# Patient Record
Sex: Female | Born: 1980 | Race: White | Hispanic: No | Marital: Single | Smoking: Current every day smoker
Health system: Southern US, Community
[De-identification: ages and names within clinical notes are randomized; demographics above are authoritative.]

## PROBLEM LIST (undated history)

## (undated) DIAGNOSIS — K219 Gastro-esophageal reflux disease without esophagitis: Secondary | ICD-10-CM

## (undated) DIAGNOSIS — I10 Essential (primary) hypertension: Secondary | ICD-10-CM

## (undated) DIAGNOSIS — N730 Acute parametritis and pelvic cellulitis: Secondary | ICD-10-CM

## (undated) HISTORY — DX: Acute parametritis and pelvic cellulitis: N73.0

## (undated) HISTORY — DX: Gastro-esophageal reflux disease without esophagitis: K21.9

## (undated) HISTORY — DX: Essential (primary) hypertension: I10

## (undated) HISTORY — PX: WISDOM TOOTH EXTRACTION: SHX21

---
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
## 1999-07-07 ENCOUNTER — Emergency Department (HOSPITAL_COMMUNITY): Admission: EM | Admit: 1999-07-07 | Discharge: 1999-07-07 | Payer: Self-pay | Admitting: Emergency Medicine

## 1999-10-12 ENCOUNTER — Emergency Department (HOSPITAL_COMMUNITY): Admission: EM | Admit: 1999-10-12 | Discharge: 1999-10-12 | Payer: Self-pay

## 1999-10-12 ENCOUNTER — Encounter: Payer: Self-pay | Admitting: Emergency Medicine

## 2000-12-03 ENCOUNTER — Other Ambulatory Visit: Admission: RE | Admit: 2000-12-03 | Discharge: 2000-12-03 | Payer: Self-pay | Admitting: Family Medicine

## 2001-03-31 DIAGNOSIS — N730 Acute parametritis and pelvic cellulitis: Secondary | ICD-10-CM

## 2001-03-31 HISTORY — DX: Acute parametritis and pelvic cellulitis: N73.0

## 2002-04-28 ENCOUNTER — Other Ambulatory Visit: Admission: RE | Admit: 2002-04-28 | Discharge: 2002-04-28 | Payer: Self-pay | Admitting: Family Medicine

## 2002-06-20 ENCOUNTER — Other Ambulatory Visit: Admission: RE | Admit: 2002-06-20 | Discharge: 2002-06-20 | Payer: Self-pay | Admitting: Obstetrics and Gynecology

## 2002-07-25 ENCOUNTER — Other Ambulatory Visit: Admission: RE | Admit: 2002-07-25 | Discharge: 2002-07-25 | Payer: Self-pay | Admitting: Obstetrics and Gynecology

## 2004-08-27 ENCOUNTER — Ambulatory Visit: Payer: Self-pay | Admitting: Family Medicine

## 2004-09-18 ENCOUNTER — Ambulatory Visit: Payer: Self-pay | Admitting: Family Medicine

## 2004-10-29 ENCOUNTER — Ambulatory Visit: Payer: Self-pay | Admitting: Family Medicine

## 2004-12-31 ENCOUNTER — Ambulatory Visit: Payer: Self-pay | Admitting: Family Medicine

## 2005-02-13 ENCOUNTER — Ambulatory Visit: Payer: Self-pay | Admitting: Family Medicine

## 2005-03-05 ENCOUNTER — Ambulatory Visit: Payer: Self-pay | Admitting: Family Medicine

## 2005-06-16 ENCOUNTER — Ambulatory Visit: Payer: Self-pay | Admitting: Family Medicine

## 2005-08-05 ENCOUNTER — Ambulatory Visit: Payer: Self-pay | Admitting: Family Medicine

## 2005-10-21 ENCOUNTER — Ambulatory Visit: Payer: Self-pay | Admitting: Family Medicine

## 2006-04-01 ENCOUNTER — Ambulatory Visit: Payer: Self-pay | Admitting: Family Medicine

## 2006-05-29 ENCOUNTER — Ambulatory Visit: Payer: Self-pay | Admitting: Family Medicine

## 2006-10-20 ENCOUNTER — Ambulatory Visit: Payer: Self-pay | Admitting: Family Medicine

## 2006-10-20 DIAGNOSIS — K219 Gastro-esophageal reflux disease without esophagitis: Secondary | ICD-10-CM

## 2006-10-20 DIAGNOSIS — I10 Essential (primary) hypertension: Secondary | ICD-10-CM

## 2006-10-22 ENCOUNTER — Telehealth: Payer: Self-pay | Admitting: Family Medicine

## 2007-01-11 ENCOUNTER — Ambulatory Visit: Payer: Self-pay | Admitting: Family Medicine

## 2007-01-11 DIAGNOSIS — N3000 Acute cystitis without hematuria: Secondary | ICD-10-CM | POA: Insufficient documentation

## 2007-01-11 LAB — CONVERTED CEMR LAB
Bilirubin Urine: NEGATIVE
Glucose, Urine, Semiquant: NEGATIVE
Ketones, urine, test strip: NEGATIVE
Nitrite: NEGATIVE
Protein, U semiquant: NEGATIVE
Specific Gravity, Urine: 1.01
Urobilinogen, UA: 0.2
WBC Urine, dipstick: NEGATIVE
pH: 6.5

## 2007-02-22 ENCOUNTER — Ambulatory Visit: Payer: Self-pay | Admitting: Family Medicine

## 2007-02-23 LAB — CONVERTED CEMR LAB
ALT: 16 units/L (ref 0–35)
AST: 16 units/L (ref 0–37)
Albumin: 4.3 g/dL (ref 3.5–5.2)
Alkaline Phosphatase: 54 units/L (ref 39–117)
BUN: 6 mg/dL (ref 6–23)
Basophils Absolute: 0 10*3/uL (ref 0.0–0.1)
Basophils Relative: 0.1 % (ref 0.0–1.0)
Bilirubin, Direct: 0.2 mg/dL (ref 0.0–0.3)
CO2: 29 meq/L (ref 19–32)
Calcium: 9.3 mg/dL (ref 8.4–10.5)
Chloride: 103 meq/L (ref 96–112)
Cholesterol: 137 mg/dL (ref 0–200)
Creatinine, Ser: 0.6 mg/dL (ref 0.4–1.2)
Eosinophils Absolute: 0.2 10*3/uL (ref 0.0–0.6)
Eosinophils Relative: 3.6 % (ref 0.0–5.0)
GFR calc Af Amer: 153 mL/min
GFR calc non Af Amer: 127 mL/min
Glucose, Bld: 77 mg/dL (ref 70–99)
HCT: 47.2 % — ABNORMAL HIGH (ref 36.0–46.0)
HDL: 33.6 mg/dL — ABNORMAL LOW (ref >39.0)
Hemoglobin: 16.5 g/dL — ABNORMAL HIGH (ref 12.0–15.0)
LDL Cholesterol: 88 mg/dL (ref 0–99)
Lymphocytes Relative: 27.5 % (ref 12.0–46.0)
MCHC: 35 g/dL (ref 30.0–36.0)
MCV: 99.4 fL (ref 78.0–100.0)
Monocytes Absolute: 0.5 10*3/uL (ref 0.2–0.7)
Monocytes Relative: 12.9 % — ABNORMAL HIGH (ref 3.0–11.0)
Neutro Abs: 2.3 10*3/uL (ref 1.4–7.7)
Neutrophils Relative %: 55.9 % (ref 43.0–77.0)
Platelets: 230 10*3/uL (ref 150–400)
Potassium: 3.7 meq/L (ref 3.5–5.1)
RBC: 4.75 M/uL (ref 3.87–5.11)
RDW: 11.6 % (ref 11.5–14.6)
Sodium: 138 meq/L (ref 135–145)
TSH: 1.9 microintl units/mL (ref 0.35–5.50)
Total Bilirubin: 1.2 mg/dL (ref 0.3–1.2)
Total CHOL/HDL Ratio: 4.1
Total Protein: 6.9 g/dL (ref 6.0–8.3)
Triglycerides: 75 mg/dL (ref 0–149)
VLDL: 15 mg/dL (ref 0–40)
WBC: 4.2 10*3/uL — ABNORMAL LOW (ref 4.5–10.5)

## 2007-10-20 ENCOUNTER — Telehealth: Payer: Self-pay | Admitting: Internal Medicine

## 2007-10-21 ENCOUNTER — Ambulatory Visit: Payer: Self-pay | Admitting: Internal Medicine

## 2007-10-21 LAB — CONVERTED CEMR LAB
Bilirubin Urine: NEGATIVE
Blood in Urine, dipstick: NEGATIVE
Glucose, Urine, Semiquant: NEGATIVE
Ketones, urine, test strip: NEGATIVE
Nitrite: NEGATIVE
Protein, U semiquant: NEGATIVE
Specific Gravity, Urine: 1.005
Urobilinogen, UA: 0.2
WBC Urine, dipstick: NEGATIVE
pH: 7

## 2007-10-27 ENCOUNTER — Telehealth: Payer: Self-pay | Admitting: Family Medicine

## 2007-11-03 ENCOUNTER — Telehealth: Payer: Self-pay | Admitting: Family Medicine

## 2007-12-10 ENCOUNTER — Telehealth: Payer: Self-pay | Admitting: Family Medicine

## 2008-03-07 ENCOUNTER — Ambulatory Visit: Payer: Self-pay | Admitting: Family Medicine

## 2008-03-07 DIAGNOSIS — F411 Generalized anxiety disorder: Secondary | ICD-10-CM | POA: Insufficient documentation

## 2008-03-07 DIAGNOSIS — J309 Allergic rhinitis, unspecified: Secondary | ICD-10-CM | POA: Insufficient documentation

## 2008-03-07 LAB — CONVERTED CEMR LAB
Cholesterol, target level: 200 mg/dL
HDL goal, serum: 40 mg/dL
LDL Goal: 130 mg/dL

## 2008-03-21 ENCOUNTER — Ambulatory Visit: Payer: Self-pay | Admitting: Family Medicine

## 2008-03-21 LAB — CONVERTED CEMR LAB
Bilirubin Urine: NEGATIVE
Blood in Urine, dipstick: NEGATIVE
Glucose, Urine, Semiquant: NEGATIVE
Ketones, urine, test strip: NEGATIVE
Nitrite: NEGATIVE
Protein, U semiquant: NEGATIVE
Specific Gravity, Urine: 1.015
Urobilinogen, UA: 0.2
WBC Urine, dipstick: NEGATIVE
pH: 7

## 2008-03-23 LAB — CONVERTED CEMR LAB
ALT: 18 units/L (ref 0–35)
AST: 22 units/L (ref 0–37)
Albumin: 4.1 g/dL (ref 3.5–5.2)
Alkaline Phosphatase: 63 units/L (ref 39–117)
BUN: 5 mg/dL — ABNORMAL LOW (ref 6–23)
Basophils Absolute: 0 10*3/uL (ref 0.0–0.1)
Basophils Relative: 0.5 % (ref 0.0–3.0)
Bilirubin, Direct: 0.1 mg/dL (ref 0.0–0.3)
CO2: 28 meq/L (ref 19–32)
Calcium: 9.2 mg/dL (ref 8.4–10.5)
Chloride: 107 meq/L (ref 96–112)
Cholesterol: 158 mg/dL (ref 0–200)
Creatinine, Ser: 0.6 mg/dL (ref 0.4–1.2)
Eosinophils Absolute: 0.2 10*3/uL (ref 0.0–0.7)
Eosinophils Relative: 3.7 % (ref 0.0–5.0)
GFR calc Af Amer: 152 mL/min
GFR calc non Af Amer: 126 mL/min
Glucose, Bld: 95 mg/dL (ref 70–99)
HCT: 44.8 % (ref 36.0–46.0)
HDL: 47.6 mg/dL (ref >39.0)
Hemoglobin: 16 g/dL — ABNORMAL HIGH (ref 12.0–15.0)
LDL Cholesterol: 98 mg/dL (ref 0–99)
Lymphocytes Relative: 31.6 % (ref 12.0–46.0)
MCHC: 35.7 g/dL (ref 30.0–36.0)
MCV: 100.8 fL — ABNORMAL HIGH (ref 78.0–100.0)
Monocytes Absolute: 0.5 10*3/uL (ref 0.1–1.0)
Monocytes Relative: 9.8 % (ref 3.0–12.0)
Neutro Abs: 2.5 10*3/uL (ref 1.4–7.7)
Neutrophils Relative %: 54.4 % (ref 43.0–77.0)
Platelets: 235 10*3/uL (ref 150–400)
Potassium: 3.2 meq/L — ABNORMAL LOW (ref 3.5–5.1)
RBC: 4.45 M/uL (ref 3.87–5.11)
RDW: 11.6 % (ref 11.5–14.6)
Sodium: 142 meq/L (ref 135–145)
TSH: 2.65 microintl units/mL (ref 0.35–5.50)
Total Bilirubin: 1 mg/dL (ref 0.3–1.2)
Total CHOL/HDL Ratio: 3.3
Total Protein: 6.9 g/dL (ref 6.0–8.3)
Triglycerides: 64 mg/dL (ref 0–149)
VLDL: 13 mg/dL (ref 0–40)
WBC: 4.7 10*3/uL (ref 4.5–10.5)

## 2008-03-28 ENCOUNTER — Ambulatory Visit: Payer: Self-pay | Admitting: Family Medicine

## 2008-04-04 ENCOUNTER — Ambulatory Visit: Payer: Self-pay | Admitting: Gastroenterology

## 2008-04-14 ENCOUNTER — Telehealth: Payer: Self-pay | Admitting: Family Medicine

## 2008-04-24 ENCOUNTER — Ambulatory Visit: Payer: Self-pay | Admitting: Gastroenterology

## 2008-05-01 ENCOUNTER — Encounter: Payer: Self-pay | Admitting: Family Medicine

## 2008-05-03 ENCOUNTER — Encounter: Payer: Self-pay | Admitting: Gastroenterology

## 2008-05-08 ENCOUNTER — Telehealth (INDEPENDENT_AMBULATORY_CARE_PROVIDER_SITE_OTHER): Payer: Self-pay | Admitting: *Deleted

## 2008-05-08 ENCOUNTER — Ambulatory Visit: Payer: Self-pay | Admitting: Family Medicine

## 2008-05-08 DIAGNOSIS — J039 Acute tonsillitis, unspecified: Secondary | ICD-10-CM

## 2008-05-10 ENCOUNTER — Telehealth: Payer: Self-pay | Admitting: Family Medicine

## 2008-06-02 ENCOUNTER — Ambulatory Visit: Payer: Self-pay | Admitting: Family Medicine

## 2008-06-02 ENCOUNTER — Telehealth (INDEPENDENT_AMBULATORY_CARE_PROVIDER_SITE_OTHER): Payer: Self-pay | Admitting: *Deleted

## 2008-06-02 LAB — CONVERTED CEMR LAB
Bilirubin Urine: NEGATIVE
Blood in Urine, dipstick: NEGATIVE
Glucose, Urine, Semiquant: NEGATIVE
Ketones, urine, test strip: NEGATIVE
Nitrite: NEGATIVE
Protein, U semiquant: NEGATIVE
Specific Gravity, Urine: 1.01
Urobilinogen, UA: 0.2
WBC Urine, dipstick: NEGATIVE
pH: 7

## 2008-06-23 ENCOUNTER — Encounter: Payer: Self-pay | Admitting: Family Medicine

## 2009-01-12 ENCOUNTER — Ambulatory Visit: Payer: Self-pay | Admitting: Family Medicine

## 2009-01-12 LAB — CONVERTED CEMR LAB
Beta hcg, urine, semiquantitative: NEGATIVE
Bilirubin Urine: NEGATIVE
Ketones, urine, test strip: NEGATIVE
Nitrite: NEGATIVE
Specific Gravity, Urine: 1.01

## 2009-05-08 ENCOUNTER — Ambulatory Visit: Payer: Self-pay | Admitting: Family Medicine

## 2009-05-09 ENCOUNTER — Ambulatory Visit: Payer: Self-pay | Admitting: Family Medicine

## 2009-05-30 ENCOUNTER — Ambulatory Visit: Payer: Self-pay | Admitting: Family Medicine

## 2009-05-30 DIAGNOSIS — J019 Acute sinusitis, unspecified: Secondary | ICD-10-CM

## 2009-06-11 ENCOUNTER — Ambulatory Visit: Payer: Self-pay | Admitting: Family Medicine

## 2009-06-11 LAB — CONVERTED CEMR LAB
Bilirubin Urine: NEGATIVE
Glucose, Urine, Semiquant: NEGATIVE
Urobilinogen, UA: 0.2

## 2009-06-12 LAB — CONVERTED CEMR LAB
ALT: 15 units/L (ref 0–35)
AST: 17 units/L (ref 0–37)
BUN: 5 mg/dL — ABNORMAL LOW (ref 6–23)
Basophils Relative: 0 % (ref 0.0–3.0)
Chloride: 110 meq/L (ref 96–112)
Cholesterol: 160 mg/dL (ref 0–200)
Eosinophils Relative: 3.8 % (ref 0.0–5.0)
GFR calc non Af Amer: 123.82 mL/min (ref >60)
HCT: 43.4 % (ref 36.0–46.0)
Hemoglobin: 14.8 g/dL (ref 12.0–15.0)
LDL Cholesterol: 96 mg/dL (ref 0–99)
Lymphs Abs: 2.1 10*3/uL (ref 0.7–4.0)
MCV: 101.5 fL — ABNORMAL HIGH (ref 78.0–100.0)
Monocytes Absolute: 0.5 10*3/uL (ref 0.1–1.0)
Monocytes Relative: 10 % (ref 3.0–12.0)
Neutro Abs: 2.3 10*3/uL (ref 1.4–7.7)
Potassium: 3.6 meq/L (ref 3.5–5.1)
RBC: 4.28 M/uL (ref 3.87–5.11)
Sodium: 144 meq/L (ref 135–145)
TSH: 3.09 microintl units/mL (ref 0.35–5.50)
Total Bilirubin: 1 mg/dL (ref 0.3–1.2)
Total Protein: 7.1 g/dL (ref 6.0–8.3)
VLDL: 13.4 mg/dL (ref 0.0–40.0)
WBC: 5.1 10*3/uL (ref 4.5–10.5)

## 2009-07-25 ENCOUNTER — Ambulatory Visit: Payer: Self-pay | Admitting: Family Medicine

## 2010-04-20 ENCOUNTER — Encounter: Payer: Self-pay | Admitting: Family Medicine

## 2010-04-30 NOTE — Assessment & Plan Note (Signed)
Summary: fu on neck pain OVER 1 WK/NJR   Vital Signs:  Patient profile:   30 year old female Weight:      149 pounds Temp:     97.7 degrees F oral BP sitting:   128 / 92  Vitals Entered By: Lynann Beaver CMA (May 08, 2009 4:06 PM) CC: neck pain Is Patient Diabetic? No   History of Present Illness: For the past week has had stiffness and pain in the neck. No trauma. No radiation down the arms. Heat and Motrin helps.  Allergies (verified): No Known Drug Allergies  Past History:  Past Medical History: Reviewed history from 03/28/2008 and no changes required. Hypertension GERD PID 2003  sees [place] for GYN exams  Past Surgical History: Reviewed history from 02/22/2007 and no changes required. wisdom teeth  Review of Systems  The patient denies anorexia, fever, weight loss, weight gain, vision loss, decreased hearing, hoarseness, chest pain, syncope, dyspnea on exertion, peripheral edema, prolonged cough, headaches, hemoptysis, abdominal pain, melena, hematochezia, severe indigestion/heartburn, hematuria, incontinence, genital sores, muscle weakness, suspicious skin lesions, transient blindness, difficulty walking, depression, unusual weight change, abnormal bleeding, enlarged lymph nodes, angioedema, breast masses, and testicular masses.    Physical Exam  General:  Well-developed,well-nourished,in no acute distress; alert,appropriate and cooperative throughout examination Neck:  tender  with lots of spasm and limited ROM    Impression & Recommendations:  Problem # 1:  NECK PAIN (ICD-723.1) Assessment Unchanged  Her updated medication list for this problem includes:    Flexeril 10 Mg Tabs (Cyclobenzaprine hcl) .Marland Kitchen... Three times a day as needed spasm    Diclofenac Sodium 50 Mg Tbec (Diclofenac sodium) .Marland Kitchen... Three times a day as needed pain  Orders: T-Cervical Spine Comp w/Flex & Ext (04540JW)  Complete Medication List: 1)  Mirena 20 Mcg/24hr Iud  (Levonorgestrel) .... Since 4-08 2)  Flexeril 10 Mg Tabs (Cyclobenzaprine hcl) .... Three times a day as needed spasm 3)  Diclofenac Sodium 50 Mg Tbec (Diclofenac sodium) .... Three times a day as needed pain  Patient Instructions: 1)  We will get Xrays of the neck.  Prescriptions: DICLOFENAC SODIUM 50 MG TBEC (DICLOFENAC SODIUM) three times a day as needed pain  #60 x 5   Entered and Authorized by:   Nelwyn Salisbury MD   Signed by:   Nelwyn Salisbury MD on 05/08/2009   Method used:   Electronically to        [facility] [address] (retail)       [address]       [place], [place]  [address]       Ph: [phone] or [phone]       Fax: [phone]   RxID:   [id number] FLEXERIL 10 MG TABS (CYCLOBENZAPRINE HCL) three times a day as needed spasm  #60 x 5   Entered and Authorized by:   Nelwyn Salisbury MD   Signed by:   Nelwyn Salisbury MD on 05/08/2009   Method used:   Electronically to        [facility] [address] (retail)       [address]       [place], [place]  [address]       Ph: [phone] or [phone]       Fax: [phone]   RxID:   [id number]

## 2010-04-30 NOTE — Assessment & Plan Note (Signed)
Summary: cpx/no pap/njr---PT [facility] // RS   Vital Signs:  Patient profile:   30 year old female Weight:      149 pounds BMI:     26.49 BP sitting:   130 / 100  (left arm) Cuff size:   regular  Vitals Entered By: Raechel Ache, RN (July 25, 2009 1:45 PM) CC: CPX,  labs done. Sees gyn.   History of Present Illness: 30 yr old female for a cpx. She feels fine, tries to eat right, and walks/runs 6 miles three days a week. her BP remains borderline high, however. She had her yearly GYN exam yesterday, and her BP there was 140/90. She continues to smoke.   Preventive Screening-Counseling & Management  Alcohol-Tobacco     Smoking Status: current     Smoking Cessation Counseling: YES     Packs/Day: 1.0  Allergies (verified): No Known Drug Allergies  Past History:  Past Medical History: Reviewed history from 03/28/2008 and no changes required. Hypertension GERD PID 2003  sees [place] for GYN exams  Past Surgical History: Reviewed history from 02/22/2007 and no changes required. wisdom teeth  Family History: Reviewed history from 04/04/2008 and no changes required. Family History of Ovarian Cancer:GM Family History of Diabetes: Father Family History of Kidney Disease:Father  Social History: Reviewed history from 10/21/2007 and no changes required. Single Current Smoker Alcohol use-yes works Designer, jewell[employer] Packs/Day:  1.0  Review of Systems  The patient denies anorexia, fever, weight loss, weight gain, vision loss, decreased hearing, hoarseness, chest pain, syncope, dyspnea on exertion, peripheral edema, prolonged cough, headaches, hemoptysis, abdominal pain, melena, hematochezia, severe indigestion/heartburn, hematuria, incontinence, genital sores, muscle weakness, suspicious skin lesions, transient blindness, difficulty walking, depression, unusual weight change, abnormal bleeding, enlarged lymph nodes, angioedema, breast masses, and testicular masses.     Physical Exam  General:  Well-developed,well-nourished,in no acute distress; alert,appropriate and cooperative throughout examination Head:  Normocephalic and atraumatic without obvious abnormalities. No apparent alopecia or balding. Eyes:  No corneal or conjunctival inflammation noted. EOMI. Perrla. Funduscopic exam benign, without hemorrhages, exudates or papilledema. Vision grossly normal. Ears:  External ear exam shows no significant lesions or deformities.  Otoscopic examination reveals clear canals, tympanic membranes are intact bilaterally without bulging, retraction, inflammation or discharge. Hearing is grossly normal bilaterally. Nose:  External nasal examination shows no deformity or inflammation. Nasal mucosa are pink and moist without lesions or exudates. Mouth:  Oral mucosa and oropharynx without lesions or exudates.  Teeth in good repair. Neck:  No deformities, masses, or tenderness noted. Chest Wall:  No deformities, masses, or tenderness noted. Lungs:  Normal respiratory effort, chest expands symmetrically. Lungs are clear to auscultation, no crackles or wheezes. Heart:  Normal rate and regular rhythm. S1 and S2 normal without gallop, murmur, click, rub or other extra sounds. Abdomen:  Bowel sounds positive,abdomen soft and non-tender without masses, organomegaly or hernias noted. Msk:  No deformity or scoliosis noted of thoracic or lumbar spine.   Pulses:  R and L carotid,radial,femoral,dorsalis pedis and posterior tibial pulses are full and equal bilaterally Extremities:  No clubbing, cyanosis, edema, or deformity noted with normal full range of motion of all joints.   Neurologic:  No cranial nerve deficits noted. Station and gait are normal. Plantar reflexes are down-going bilaterally. DTRs are symmetrical throughout. Sensory, motor and coordinative functions appear intact. Skin:  Intact without suspicious lesions or rashes Cervical Nodes:  No lymphadenopathy noted Axillary  Nodes:  No palpable lymphadenopathy Inguinal Nodes:  No significant  adenopathy Psych:  Cognition and judgment appear intact. Alert and cooperative with normal attention span and concentration. No apparent delusions, illusions, hallucinations   Impression & Recommendations:  Problem # 1:  WELL ADULT EXAM (ICD-V70.0)  Complete Medication List: 1)  Mirena 20 Mcg/24hr Iud (Levonorgestrel) .... Since 4-08 2)  Chantix Starting Month Pak 0.5 Mg X 11 & 1 Mg X 42 Tabs (Varenicline tartrate) .... As directed 3)  Chantix Continuing Month Pak 1 Mg Tabs (Varenicline tartrate) .... As directed  Patient Instructions: 1)  Tobacco is very bad for your health and your loved ones ! You should stop smoking !  2)  It is important that you exercise reguarly at least 20 minutes 5 times a week. If you develop chest pain, have severe difficulty breathing, or feel very tired, stop exercising immediately and seek medical attention.  3)  You need to lose weight. Consider a lower calorie diet and regular exercise.  4)  Please schedule a follow-up appointment in 3 months .  Prescriptions: CHANTIX CONTINUING MONTH PAK 1 MG TABS (VARENICLINE TARTRATE) as directed  #1 x 1   Entered and Authorized by:   Nelwyn Salisbury MD   Signed by:   Nelwyn Salisbury MD on 07/25/2009   Method used:   Electronically to        [facility] [address] (retail)       [address]       [place], [place]  [address]       Ph: [phone] or [phone]       Fax: [phone]   RxID:   [id number] CHANTIX STARTING MONTH PAK 0.5 MG X 11 & 1 MG X 42 TABS (VARENICLINE TARTRATE) as directed  #1 x 0   Entered and Authorized by:   Nelwyn Salisbury MD   Signed by:   Nelwyn Salisbury MD on 07/25/2009   Method used:   Electronically to        [facility] [address] (retail)       [address]       [place], [place]  [address]       Ph: [phone] or [phone]        Fax: [phone]   RxID:   [id number]

## 2010-04-30 NOTE — Assessment & Plan Note (Signed)
Summary: ST, CONGESTION // RS   Vital Signs:  Patient profile:   30 year old female Weight:      150 pounds Temp:     96.8 degrees F oral Pulse rate:   93 / minute BP sitting:   134 / 102  (left arm) Cuff size:   large  Vitals Entered By: Alfred Levins, CMA (May 30, 2009 3:17 PM) CC: sinus pressure, st, low grade fever x4 days   History of Present Illness: Here for one week of sinus pressure, HA, PND, ST, fever to 99 degrees, and a dry cough. No NVD.   Current Medications (verified): 1)  Mirena 20 Mcg/24hr  Iud (Levonorgestrel) .... Since 4-08  Allergies (verified): No Known Drug Allergies  Past History:  Past Medical History: Reviewed history from 03/28/2008 and no changes required. Hypertension GERD PID 2003  sees [place] for GYN exams  Review of Systems  The patient denies anorexia, weight loss, weight gain, vision loss, decreased hearing, hoarseness, chest pain, syncope, dyspnea on exertion, peripheral edema, hemoptysis, abdominal pain, melena, hematochezia, severe indigestion/heartburn, hematuria, incontinence, genital sores, muscle weakness, suspicious skin lesions, transient blindness, difficulty walking, depression, unusual weight change, abnormal bleeding, enlarged lymph nodes, angioedema, breast masses, and testicular masses.    Physical Exam  General:  Well-developed,well-nourished,in no acute distress; alert,appropriate and cooperative throughout examination Head:  Normocephalic and atraumatic without obvious abnormalities. No apparent alopecia or balding. Eyes:  No corneal or conjunctival inflammation noted. EOMI. Perrla. Funduscopic exam benign, without hemorrhages, exudates or papilledema. Vision grossly normal. Ears:  External ear exam shows no significant lesions or deformities.  Otoscopic examination reveals clear canals, tympanic membranes are intact bilaterally without bulging, retraction, inflammation or discharge. Hearing is grossly normal  bilaterally. Nose:  External nasal examination shows no deformity or inflammation. Nasal mucosa are pink and moist without lesions or exudates. Mouth:  Oral mucosa and oropharynx without lesions or exudates.  Teeth in good repair. Neck:  No deformities, masses, or tenderness noted. Lungs:  Normal respiratory effort, chest expands symmetrically. Lungs are clear to auscultation, no crackles or wheezes.   Impression & Recommendations:  Problem # 1:  ACUTE SINUSITIS, UNSPECIFIED (ICD-461.9)  Her updated medication list for this problem includes:    Zithromax Z-pak 250 Mg Tabs (Azithromycin) .Marland Kitchen... As directed  Complete Medication List: 1)  Mirena 20 Mcg/24hr Iud (Levonorgestrel) .... Since 4-08 2)  Zithromax Z-pak 250 Mg Tabs (Azithromycin) .... As directed  Patient Instructions: 1)  Please schedule a follow-up appointment as needed .  Prescriptions: ZITHROMAX Z-PAK 250 MG TABS (AZITHROMYCIN) as directed  #1 x 0   Entered and Authorized by:   Nelwyn Salisbury MD   Signed by:   Nelwyn Salisbury MD on 05/30/2009   Method used:   Electronically to        [facility] [address] [phone]* (retail)       [address]       [place], [place]  [address]       Ph: [phone] or [phone]       Fax: [phone]   RxID:   [id number]

## 2010-08-16 NOTE — Assessment & Plan Note (Signed)
[facility]                                 ON-CALL NOTE   RAEANA, BLINN                   MRN:          [id number]  DATE:04/11/2006                            DOB:          [birth year]    PRIMARY CARE PHYSICIAN:  Dr. Clent Ridges.   Ms. Freelove calls in today stating that she has thrush. She wanted a  medication to be called in.   PLAN:  The patient was offered to come to the Saturday clinic to be  assessed.     Leanne Chang, M.D.  Electronically Signed    LA/MedQ  DD: 04/11/2006  DT: 04/12/2006  Job #: [id number]

## 2011-01-09 ENCOUNTER — Encounter: Payer: Self-pay | Admitting: Family Medicine

## 2011-01-10 ENCOUNTER — Encounter: Payer: Self-pay | Admitting: Family Medicine

## 2011-01-10 ENCOUNTER — Ambulatory Visit (INDEPENDENT_AMBULATORY_CARE_PROVIDER_SITE_OTHER): Payer: Managed Care, Other (non HMO) | Admitting: Family Medicine

## 2011-01-10 VITALS — BP 120/80 | HR 95 | Temp 98.7°F

## 2011-01-10 DIAGNOSIS — L259 Unspecified contact dermatitis, unspecified cause: Secondary | ICD-10-CM

## 2011-01-10 DIAGNOSIS — L309 Dermatitis, unspecified: Secondary | ICD-10-CM

## 2011-01-10 MED ORDER — HALOBETASOL PROPIONATE 0.05 % EX CREA: TOPICAL_CREAM | Freq: Two times a day (BID) | CUTANEOUS | Status: AC

## 2011-01-10 NOTE — Progress Notes (Signed)
  Subjective:    Patient ID: Becky Estrada, female    DOB: 30-Sep-1980, 30 y.o.   MRN: [id number]  HPI Here for an itchy rash on both lower legs for the past 2 months.Using OTC moisturizers.    Review of Systems  Constitutional: Negative.   Skin: Positive for rash.       Objective:   Physical Exam  Constitutional: She appears well-developed and well-nourished.  Skin:       Patches of red, macular, scaly skin on the lower legs           Assessment & Plan:  Recheck prn

## 2011-01-27 ENCOUNTER — Other Ambulatory Visit (INDEPENDENT_AMBULATORY_CARE_PROVIDER_SITE_OTHER): Payer: Managed Care, Other (non HMO)

## 2011-01-27 DIAGNOSIS — Z0000 Encounter for general adult medical examination without abnormal findings: Secondary | ICD-10-CM

## 2011-01-27 LAB — BASIC METABOLIC PANEL
CO2: 26 mEq/L (ref 19–32)
Calcium: 8.7 mg/dL (ref 8.4–10.5)
Creatinine, Ser: 0.7 mg/dL (ref 0.4–1.2)
GFR: 106.06 mL/min (ref >60.00)
Sodium: 136 mEq/L (ref 135–145)

## 2011-01-27 LAB — CBC WITH DIFFERENTIAL/PLATELET
Basophils Absolute: 0 10*3/uL (ref 0.0–0.1)
Basophils Relative: 0.2 % (ref 0.0–3.0)
Eosinophils Absolute: 0.2 10*3/uL (ref 0.0–0.7)
Hemoglobin: 15.5 g/dL — ABNORMAL HIGH (ref 12.0–15.0)
Lymphocytes Relative: 36.5 % (ref 12.0–46.0)
MCHC: 34.9 g/dL (ref 30.0–36.0)
Monocytes Relative: 10.2 % (ref 3.0–12.0)
Neutrophils Relative %: 48.8 % (ref 43.0–77.0)
RBC: 4.25 Mil/uL (ref 3.87–5.11)
RDW: 12.7 % (ref 11.5–14.6)

## 2011-01-27 LAB — HEPATIC FUNCTION PANEL
AST: 25 U/L (ref 0–37)
Alkaline Phosphatase: 70 U/L (ref 39–117)
Bilirubin, Direct: 0.1 mg/dL (ref 0.0–0.3)

## 2011-01-27 LAB — TSH: TSH: 2.91 u[IU]/mL (ref 0.35–5.50)

## 2011-01-27 LAB — LIPID PANEL
HDL: 44.6 mg/dL (ref >39.00)
Total CHOL/HDL Ratio: 3
VLDL: 24.8 mg/dL (ref 0.0–40.0)

## 2011-01-27 LAB — POCT URINALYSIS DIPSTICK
Bilirubin, UA: NEGATIVE
Ketones, UA: NEGATIVE
Leukocytes, UA: NEGATIVE
pH, UA: 7

## 2011-01-30 ENCOUNTER — Telehealth: Payer: Self-pay | Admitting: Family Medicine

## 2011-01-30 NOTE — Telephone Encounter (Signed)
Message copied by Baldemar Friday on Thu Jan 30, 2011  1:49 PM ------      Message from: Gershon Crane A      Created: Tue Jan 28, 2011  8:47 AM       normal

## 2011-01-30 NOTE — Telephone Encounter (Signed)
Left voice message with normal results. 

## 2011-02-03 ENCOUNTER — Telehealth: Payer: Self-pay | Admitting: Family Medicine

## 2011-02-03 ENCOUNTER — Encounter: Payer: Self-pay | Admitting: Family Medicine

## 2011-02-03 NOTE — Telephone Encounter (Signed)
Pt had to cancel cpx due to mtg in [place] today. Pt need cpx before 02-13-2011. Can I create 30 min slot.

## 2011-02-04 NOTE — Telephone Encounter (Signed)
lmom for pt to cb

## 2011-02-04 NOTE — Telephone Encounter (Signed)
Please do so

## 2011-02-07 NOTE — Telephone Encounter (Signed)
lmom for pt to cb

## 2011-02-10 NOTE — Telephone Encounter (Signed)
Pt returned call re: sch work in cpx. Pt said that it has to be before 02/12/11 not 02/13/11. Pt has been sch for cpx on 02/11/11 at 3:45pm as noted.

## 2011-02-11 ENCOUNTER — Ambulatory Visit (INDEPENDENT_AMBULATORY_CARE_PROVIDER_SITE_OTHER): Payer: Managed Care, Other (non HMO) | Admitting: Family Medicine

## 2011-02-11 ENCOUNTER — Encounter: Payer: Self-pay | Admitting: Family Medicine

## 2011-02-11 VITALS — BP 140/90 | HR 99 | Temp 98.3°F | Ht 63.0 in | Wt 167.0 lb

## 2011-02-11 DIAGNOSIS — Z0000 Encounter for general adult medical examination without abnormal findings: Secondary | ICD-10-CM

## 2011-02-11 NOTE — Progress Notes (Signed)
  Subjective:    Patient ID: Becky Estrada, female    DOB: 03/17/1981, 30 y.o.   MRN: [id number]  HPI 30 yr old female for a cpx. She feels well except she has noticed some spider veins in her legs over the past year, and her legs get sensitive or painful at times. They do not swell very much. She is on her feet all day on her job.    Review of Systems  Constitutional: Negative.   HENT: Negative.   Eyes: Negative.   Respiratory: Negative.   Cardiovascular: Negative.   Gastrointestinal: Negative.   Genitourinary: Negative for dysuria, urgency, frequency, hematuria, flank pain, decreased urine volume, enuresis, difficulty urinating, pelvic pain and dyspareunia.  Musculoskeletal: Negative.   Skin: Negative.   Neurological: Negative.   Hematological: Negative.   Psychiatric/Behavioral: Negative.        Objective:   Physical Exam  Constitutional: She is oriented to person, place, and time. She appears well-developed and well-nourished. No distress.  HENT:  Head: Normocephalic and atraumatic.  Right Ear: External ear normal.  Left Ear: External ear normal.  Nose: Nose normal.  Mouth/Throat: Oropharynx is clear and moist. No oropharyngeal exudate.  Eyes: Conjunctivae and EOM are normal. Pupils are equal, round, and reactive to light. No scleral icterus.  Neck: Normal range of motion. Neck supple. No JVD present. No thyromegaly present.  Cardiovascular: Normal rate, regular rhythm, normal heart sounds and intact distal pulses.  Exam reveals no gallop and no friction rub.   No murmur heard. Pulmonary/Chest: Effort normal and breath sounds normal. No respiratory distress. She has no wheezes. She has no rales. She exhibits no tenderness.  Abdominal: Soft. Bowel sounds are normal. She exhibits no distension and no mass. There is no tenderness. There is no rebound and no guarding.  Musculoskeletal: Normal range of motion. She exhibits no edema and no tenderness.       She has numerous  superficial varicosities in the legs from the knees down   Lymphadenopathy:    She has no cervical adenopathy.  Neurological: She is alert and oriented to person, place, and time. She has normal reflexes. No cranial nerve deficit. She exhibits normal muscle tone. Coordination normal.  Skin: Skin is warm and dry. No rash noted. No erythema.  Psychiatric: She has a normal mood and affect. Her behavior is normal. Judgment and thought content normal.          Assessment & Plan:  She has some varicose veins, and her weight has a lot to do with this. She needs to exercise and lose weight. She can try wearing support stockings when on her feet.

## 2011-02-14 ENCOUNTER — Encounter: Payer: Self-pay | Admitting: Internal Medicine

## 2011-02-14 ENCOUNTER — Ambulatory Visit (INDEPENDENT_AMBULATORY_CARE_PROVIDER_SITE_OTHER): Payer: Managed Care, Other (non HMO) | Admitting: Internal Medicine

## 2011-02-14 VITALS — BP 120/80 | HR 123 | Temp 98.5°F | Wt 168.0 lb

## 2011-02-14 DIAGNOSIS — F17200 Nicotine dependence, unspecified, uncomplicated: Secondary | ICD-10-CM

## 2011-02-14 DIAGNOSIS — R6889 Other general symptoms and signs: Secondary | ICD-10-CM

## 2011-02-14 NOTE — Progress Notes (Signed)
  Subjective:    Patient ID: Becky Estrada, female    DOB: 10/28/1980, 30 y.o.   MRN: [id number]  HPI Patient comes in today for SDA  For acute problem evaluation. Less tthan or about 24 hours if body aches malaise upeer resp congestion some HA Some cough  NO cp sob fever but some chills . Slept in didn't get flu shot for psychological reasons yet.  Tobacco daily  but stopped for 2 days   Review of Systems No vision hearting change nvd rash noted  No uti sx  No NVD  Past history family history social history reviewed in the electronic medical record.     Objective:   Physical Exam  WDWN in nad looks tired  Non toxic  Prefers to lay down. HEENT: Normocephalic ;atraumatic , Eyes;  PERRL, EOMs  Full, lids and conjunctiva clear,,Ears: no deformities, canals nl, TM landmarks normal, Nose: no deformity or discharge  But congested  Mouth : OP clear without lesion or edema . Tonsil 1 + no exudate Neck: Supple without adenopathy or masses or bruits Chest:  Clear to A&P without wheezes rales or rhonchi CV:  S1-S2 no gallops or murmurs peripheral perfusion is normal Abdomen:  Sof,t normal bowel sounds without hepatosplenomegaly, no guarding rebound or masses no CVA tenderness No clubbing cyanosis or edema Skin: normal capillary refill ,turgor , color: No acute rashes ,petechiae or bruising      Assessment & Plan:  Acute illness  Seems flu like  resp . Non focal otherwise  No evidence of pneumonia  Or complications  Tobacco  Counseled. To stop    Expectant management. And rest  Call or seek care  if  persistent or progressive or alarm symptoms as discussed.

## 2011-02-14 NOTE — Patient Instructions (Signed)
This is a flu like illness . Rest fluids tylenol or advil for body aches  Feverish can last 3 days call if longer  Or if shortness of breath or severe pain Cough could last for a  Week or 2 . Call if need note for work.

## 2011-02-17 ENCOUNTER — Telehealth: Payer: Self-pay | Admitting: Family Medicine

## 2011-02-17 NOTE — Telephone Encounter (Signed)
Pt stated she will go to [facility]

## 2011-02-17 NOTE — Telephone Encounter (Signed)
I can see her tomorrow.

## 2011-02-17 NOTE — Telephone Encounter (Signed)
Pt saw dr Fabian Sharp on 02-14-2011 for flu like symptoms. Pt has fever and sore throat now requesting to see dr fry today.

## 2011-02-27 ENCOUNTER — Telehealth: Payer: Self-pay | Admitting: Family Medicine

## 2011-02-27 NOTE — Telephone Encounter (Signed)
Pt has irritation in both eyes woke up this morning and right eye was crusted shut and eye had been "goopy" through out the day. Pt requesting for something to be called in. Please contact

## 2011-02-28 MED ORDER — NEOMYCIN-POLYMYXIN-HC 3.5-10000-1 OP SUSP: 1.0000 [drp] | Freq: Four times a day (QID) | OPHTHALMIC | Status: AC

## 2011-02-28 NOTE — Telephone Encounter (Signed)
rx sent to pharmacy.  Pt aware.  

## 2011-02-28 NOTE — Telephone Encounter (Signed)
Pt is aware waiting on MD to reply.

## 2011-02-28 NOTE — Telephone Encounter (Signed)
Call in Cortisporin eye drops to use qid prn, 10 ml with no rf

## 2011-10-17 ENCOUNTER — Encounter: Payer: Self-pay | Admitting: Family Medicine

## 2011-10-17 ENCOUNTER — Ambulatory Visit (INDEPENDENT_AMBULATORY_CARE_PROVIDER_SITE_OTHER): Payer: Managed Care, Other (non HMO) | Admitting: Family Medicine

## 2011-10-17 VITALS — BP 124/86 | HR 84 | Temp 98.5°F | Wt 171.0 lb

## 2011-10-17 DIAGNOSIS — J039 Acute tonsillitis, unspecified: Secondary | ICD-10-CM

## 2011-10-17 DIAGNOSIS — L309 Dermatitis, unspecified: Secondary | ICD-10-CM

## 2011-10-17 DIAGNOSIS — L259 Unspecified contact dermatitis, unspecified cause: Secondary | ICD-10-CM

## 2011-10-17 MED ORDER — CEPHALEXIN 500 MG PO CAPS: 500.0000 mg | ORAL_CAPSULE | Freq: Three times a day (TID) | ORAL | Status: AC

## 2011-10-17 NOTE — Progress Notes (Signed)
  Subjective:    Patient ID: Becky Estrada, female    DOB: [birth year], 31 y.o.   MRN: [id number]  HPI Here for 2 reasons. First she has had 2 days of ST and swollen tonsils. No fever. Second her eczema is out of control despite treatment with Halobetasol cream.    Review of Systems  Constitutional: Negative.   HENT: Negative for congestion, postnasal drip and sinus pressure.   Eyes: Negative.   Respiratory: Negative.        Objective:   Physical Exam  Constitutional: She appears well-developed and well-nourished.  HENT:  Right Ear: External ear normal.  Left Ear: External ear normal.  Nose: Nose normal.  Mouth/Throat: No oropharyngeal exudate.       Tonsils are pink and swollen  Eyes: Conjunctivae are normal.  Neck: No thyromegaly present.  Pulmonary/Chest: Effort normal and breath sounds normal.  Lymphadenopathy:    She has no cervical adenopathy.  Skin:       Plaques of scaly skin over the legs           Assessment & Plan:  Treat the throat with Keflex. Refer to Dermatology.

## 2011-11-04 ENCOUNTER — Encounter: Payer: Self-pay | Admitting: Speech Pathology

## 2011-11-18 ENCOUNTER — Telehealth: Payer: Self-pay | Admitting: Family Medicine

## 2011-11-18 DIAGNOSIS — R221 Localized swelling, mass and lump, neck: Secondary | ICD-10-CM

## 2011-11-18 NOTE — Telephone Encounter (Signed)
Pt called and is req to get a referral to ENT re: the feeling of lump in pts throat, as previously discussed with Dr Clent Ridges. Pt said that symptoms are the same. Not going away. Pls leave detailed info on vm if pt not avail.

## 2011-11-19 NOTE — Telephone Encounter (Signed)
Referral was done  

## 2011-11-19 NOTE — Telephone Encounter (Signed)
I spoke with pt  

## 2012-10-05 ENCOUNTER — Telehealth: Payer: Self-pay | Admitting: Family Medicine

## 2012-10-05 ENCOUNTER — Encounter: Payer: Self-pay | Admitting: Family Medicine

## 2012-10-05 ENCOUNTER — Ambulatory Visit (INDEPENDENT_AMBULATORY_CARE_PROVIDER_SITE_OTHER): Payer: Managed Care, Other (non HMO) | Admitting: Family Medicine

## 2012-10-05 VITALS — BP 170/110 | HR 88 | Temp 97.9°F | Wt 172.0 lb

## 2012-10-05 DIAGNOSIS — I10 Essential (primary) hypertension: Secondary | ICD-10-CM

## 2012-10-05 LAB — POCT URINALYSIS DIPSTICK
Bilirubin, UA: NEGATIVE
Blood, UA: NEGATIVE
Glucose, UA: NEGATIVE
Ketones, UA: NEGATIVE
pH, UA: 7

## 2012-10-05 MED ORDER — METOPROLOL SUCCINATE ER 50 MG PO TB24: 50.0000 mg | ORAL_TABLET | Freq: Every day | ORAL | Status: DC

## 2012-10-05 NOTE — Telephone Encounter (Signed)
Patient Information:  Caller Name: Glenda  Phone: [phone]  Patient: Becky Estrada, Becky Estrada  Gender: Female  DOB: October 18, 1980  Age: 32 Years  PCP: Gershon Crane [facility])  Pregnant: No  Office Follow Up:  Does the office need to follow up with this patient?: No  Instructions For The Office: N/A  RN Note:  Mirena IUD.  BP 156/125 L arm. Back pain resolved.  Continues to have mild shaking. Feeling very anxious due to hypertension.  Symptoms  Reason For Call & Symptoms: Emergent Call: Reported near syncope with vision turning black, shaking when sat down and lower back began aching.  Reviewed Health History In EMR: N/A  Reviewed Medications In EMR: N/A  Reviewed Allergies In EMR: N/A  Reviewed Surgeries / Procedures: N/A  Date of Onset of Symptoms: 10/05/2012  Treatments Tried: sat down  Treatments Tried Worked: No OB / GYN:  LMP: Unknown  Guideline(s) Used:  High Blood Pressure  Disposition Per Guideline:   See Today in Office  Reason For Disposition Reached:   BP > 180/110  Advice Given:  N/A  Patient Will Follow Care Advice:  YES  Appointment Scheduled:  10/05/2012 15:45:00 Appointment Scheduled Provider:  Gershon Crane (Family Practice)

## 2012-10-05 NOTE — Progress Notes (Signed)
  Subjective:    Patient ID: Becky Estrada, female    DOB: 09-06-1980, 32 y.o.   MRN: [id number]  HPI Here for an episode at work this morning where she mildly choked on a piece of food and then suddenly felt lightheaded and thought she might pass out. She did not lose consciousness however, and after sitting down for a few minutes she felt better. No SOB or chest pains or palpitations. No HA. No neurologic deficits. 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 She continues to smoke one ppd of cigarettes.    Review of Systems  Constitutional: Negative.   Respiratory: Negative.   Cardiovascular: Negative.   Neurological: Positive for light-headedness. Negative for dizziness, tremors, seizures, syncope, facial asymmetry, speech difficulty, weakness, numbness and headaches.       Objective:   Physical Exam  Constitutional: She is oriented to person, place, and time. She appears well-developed and well-nourished.  Neck: No thyromegaly present.  Cardiovascular: Normal rate, regular rhythm, normal heart sounds and intact distal pulses.   Pulmonary/Chest: Effort normal and breath sounds normal.  Musculoskeletal: She exhibits no edema.  Lymphadenopathy:    She has no cervical adenopathy.  Neurological: She is alert and oriented to person, place, and time. She has normal reflexes. No cranial nerve deficit. She exhibits normal muscle tone. Coordination normal.          Assessment & Plan:  New onset HTN. We have not seen her for a year, so it is hard to tell how long it has been elevated. I strongly urged her to quit smoking. We discussed limiting dietary sodium intake. Start on Metoprolol succinate 50 mg daily. Get labs today. She will recheck in 2 weeks

## 2012-10-06 LAB — CBC WITH DIFFERENTIAL/PLATELET
Eosinophils Relative: 3 % (ref 0.0–5.0)
HCT: 47.5 % — ABNORMAL HIGH (ref 36.0–46.0)
Hemoglobin: 16.6 g/dL — ABNORMAL HIGH (ref 12.0–15.0)
Lymphs Abs: 1.4 10*3/uL (ref 0.7–4.0)
Monocytes Relative: 7.8 % (ref 3.0–12.0)
Neutro Abs: 4 10*3/uL (ref 1.4–7.7)
WBC: 6 10*3/uL (ref 4.5–10.5)

## 2012-10-06 LAB — BASIC METABOLIC PANEL
Chloride: 106 mEq/L (ref 96–112)
Potassium: 3.9 mEq/L (ref 3.5–5.1)
Sodium: 141 mEq/L (ref 135–145)

## 2012-10-06 LAB — TSH: TSH: 2.04 u[IU]/mL (ref 0.35–5.50)

## 2012-10-15 ENCOUNTER — Other Ambulatory Visit: Payer: Managed Care, Other (non HMO)

## 2012-10-18 NOTE — Progress Notes (Signed)
Quick Note:  Pt has appointment on 10/22/12 will go over then. ______

## 2012-10-22 ENCOUNTER — Encounter: Payer: Managed Care, Other (non HMO) | Admitting: Family Medicine

## 2012-10-22 DIAGNOSIS — Z0289 Encounter for other administrative examinations: Secondary | ICD-10-CM

## 2013-02-11 ENCOUNTER — Ambulatory Visit (INDEPENDENT_AMBULATORY_CARE_PROVIDER_SITE_OTHER): Payer: Managed Care, Other (non HMO) | Admitting: Family Medicine

## 2013-02-11 ENCOUNTER — Encounter: Payer: Self-pay | Admitting: Family Medicine

## 2013-02-11 VITALS — BP 174/110 | HR 90 | Temp 97.4°F | Wt 184.0 lb

## 2013-02-11 DIAGNOSIS — F411 Generalized anxiety disorder: Secondary | ICD-10-CM

## 2013-02-11 DIAGNOSIS — N390 Urinary tract infection, site not specified: Secondary | ICD-10-CM

## 2013-02-11 DIAGNOSIS — I10 Essential (primary) hypertension: Secondary | ICD-10-CM

## 2013-02-11 DIAGNOSIS — F329 Major depressive disorder, single episode, unspecified: Secondary | ICD-10-CM

## 2013-02-11 DIAGNOSIS — R300 Dysuria: Secondary | ICD-10-CM

## 2013-02-11 DIAGNOSIS — F1010 Alcohol abuse, uncomplicated: Secondary | ICD-10-CM

## 2013-02-11 DIAGNOSIS — N760 Acute vaginitis: Secondary | ICD-10-CM

## 2013-02-11 DIAGNOSIS — F32A Depression, unspecified: Secondary | ICD-10-CM

## 2013-02-11 DIAGNOSIS — R309 Painful micturition, unspecified: Secondary | ICD-10-CM

## 2013-02-11 LAB — POCT URINALYSIS DIPSTICK
Bilirubin, UA: NEGATIVE
Glucose, UA: NEGATIVE
Ketones, UA: NEGATIVE
Nitrite, UA: NEGATIVE
Spec Grav, UA: 1.015
pH, UA: 7

## 2013-02-11 MED ORDER — FLUCONAZOLE 150 MG PO TABS: 150.0000 mg | ORAL_TABLET | Freq: Once | ORAL | Status: DC

## 2013-02-11 NOTE — Progress Notes (Signed)
  Subjective:    Patient ID: Becky Estrada, female    DOB: 27-Dec-1980, 32 y.o.   MRN: [id number]  HPI Her initially for several days of vaginal burning and itching. No DC or odor. Some burning on urination but no urgency or frequency. No fever. Then I asked her about her very high BP today and she admitted that she has not been taking her BP pill for many months. Then when I asked her why she replied that she is afraid that this would interact with the large amount of alcohol she drinks daily. It turns out that she has been drinking 8 glasses of wine (2 bottles) by herself every night for the past 5 years. She knows this is harmful and she wants to stop, but she is afraid of having withdrawal symptoms. She had a grandfather who was an alcoholic. She denies using any illicit drugs. She never drinks during the daytime. She works full time. She has a 65 year old son. She admits to dealing with depression and anxiety for years but she has never told anyone about this.    Review of Systems  Constitutional: Negative.   Respiratory: Negative.   Cardiovascular: Negative.   Genitourinary: Positive for dysuria. Negative for urgency, frequency, hematuria, flank pain, vaginal bleeding, vaginal discharge, vaginal pain and pelvic pain.  Neurological: Negative.   Psychiatric/Behavioral: Positive for dysphoric mood. Negative for suicidal ideas, hallucinations, confusion, sleep disturbance, self-injury, decreased concentration and agitation. The patient is nervous/anxious.        Objective:   Physical Exam  Constitutional: She is oriented to person, place, and time. She appears well-developed and well-nourished. No distress.  Abdominal: Soft. Bowel sounds are normal. She exhibits no distension and no mass. There is no tenderness. There is no rebound and no guarding.  Neurological: She is alert and oriented to person, place, and time.  Psychiatric: Her behavior is normal. Thought content normal.  Tearful            Assessment & Plan:  It sounds like she has a yeast infection so we will treat with Diflucan. Doubt she has a UTI but we will culture her urine to be sure. Advised her to take her Metoprolol every day as directed to get the HTN under control. We is dealing with alcoholism, and we had a frank discussion about this. I think she needs to stop alcohol completely and she agrees. She will limit herself to drinking npo more than one bottle of wine per day starting tonight, and we will help her taper off. I urged her to meet with a therapist and she agreed. There is an EAP program through her employer so she will contact them first thing next week. She does not want to take any medications for anxiety or depression right now, but I think she would benefit from this. We will meet again in one week to follow up. We will get some lab work at that time.

## 2013-02-13 LAB — URINE CULTURE: Organism ID, Bacteria: NO GROWTH

## 2013-02-18 ENCOUNTER — Ambulatory Visit: Payer: Managed Care, Other (non HMO) | Admitting: Family Medicine

## 2013-02-18 DIAGNOSIS — Z0289 Encounter for other administrative examinations: Secondary | ICD-10-CM

## 2013-06-07 ENCOUNTER — Other Ambulatory Visit: Payer: Self-pay | Admitting: Family Medicine

## 2013-06-27 ENCOUNTER — Encounter: Payer: Self-pay | Admitting: Family Medicine

## 2013-06-27 ENCOUNTER — Ambulatory Visit (INDEPENDENT_AMBULATORY_CARE_PROVIDER_SITE_OTHER): Payer: Managed Care, Other (non HMO) | Admitting: Family Medicine

## 2013-06-27 VITALS — BP 160/108 | HR 104 | Temp 98.6°F | Ht 63.0 in | Wt 191.0 lb

## 2013-06-27 DIAGNOSIS — F17200 Nicotine dependence, unspecified, uncomplicated: Secondary | ICD-10-CM

## 2013-06-27 DIAGNOSIS — F411 Generalized anxiety disorder: Secondary | ICD-10-CM

## 2013-06-27 DIAGNOSIS — F1010 Alcohol abuse, uncomplicated: Secondary | ICD-10-CM

## 2013-06-27 DIAGNOSIS — I10 Essential (primary) hypertension: Secondary | ICD-10-CM

## 2013-06-27 MED ORDER — METOPROLOL SUCCINATE ER 100 MG PO TB24: 100.0000 mg | ORAL_TABLET | Freq: Every day | ORAL | Status: DC

## 2013-06-27 NOTE — Progress Notes (Signed)
Pre visit review using our clinic review tool, if applicable. No additional management support is needed unless otherwise documented below in the visit note. 

## 2013-06-27 NOTE — Progress Notes (Signed)
   Subjective:    Patient ID: Becky Estrada, female    DOB: 20-Nov-1980, 33 y.o.   MRN: [id number]  HPI Here to follow up on her BP. She does not check it at home but she feels her heart racing when she gets stressed. She has started using an electronic cigarette some and is slowly decreasing her tobacco use. She has decreased her daily wine consumption to 1/2 a bottle.    Review of Systems  Constitutional: Negative.   Respiratory: Negative.   Cardiovascular: Negative.        Objective:   Physical Exam  Constitutional: She appears well-developed and well-nourished.  Cardiovascular: Normal rate, regular rhythm, normal heart sounds and intact distal pulses.   Pulmonary/Chest: Effort normal and breath sounds normal.  Psychiatric: She has a normal mood and affect. Her behavior is normal. Thought content normal.          Assessment & Plan:  We will increase the Metoprolol to 100 mg daily. Encouraged her to exercise and lose some weight. Recheck in 2 months

## 2013-06-28 ENCOUNTER — Telehealth: Payer: Self-pay | Admitting: Family Medicine

## 2013-06-28 NOTE — Telephone Encounter (Signed)
Relevant patient education assigned to patient using Emmi. ° °

## 2013-07-05 ENCOUNTER — Telehealth: Payer: Self-pay | Admitting: Family Medicine

## 2013-07-05 NOTE — Telephone Encounter (Signed)
I spoke with pt and advised her to schedule a office visit with another provider. Dr. Clent RidgesFry will not be returning until next week 07/11/13.

## 2013-07-05 NOTE — Telephone Encounter (Signed)
Pt states she needs an antibiotic for a yeast infection. States dr. Clent RidgesFry has written her an rx before for a yeast infection. Pt was not sure of the name.

## 2013-08-29 ENCOUNTER — Telehealth: Payer: Self-pay | Admitting: Family Medicine

## 2013-08-29 ENCOUNTER — Encounter (HOSPITAL_COMMUNITY): Payer: Self-pay | Admitting: Emergency Medicine

## 2013-08-29 ENCOUNTER — Emergency Department (HOSPITAL_COMMUNITY): Payer: Managed Care, Other (non HMO)

## 2013-08-29 ENCOUNTER — Emergency Department (HOSPITAL_COMMUNITY)
Admission: EM | Admit: 2013-08-29 | Discharge: 2013-08-29 | Disposition: A | Discharge status: Home / Self Care | Payer: Managed Care, Other (non HMO) | Attending: Emergency Medicine | Admitting: Emergency Medicine

## 2013-08-29 DIAGNOSIS — Z79899 Other long term (current) drug therapy: Secondary | ICD-10-CM | POA: Insufficient documentation

## 2013-08-29 DIAGNOSIS — F17200 Nicotine dependence, unspecified, uncomplicated: Secondary | ICD-10-CM | POA: Insufficient documentation

## 2013-08-29 DIAGNOSIS — I10 Essential (primary) hypertension: Secondary | ICD-10-CM | POA: Insufficient documentation

## 2013-08-29 DIAGNOSIS — Z8742 Personal history of other diseases of the female genital tract: Secondary | ICD-10-CM | POA: Insufficient documentation

## 2013-08-29 DIAGNOSIS — R0602 Shortness of breath: Secondary | ICD-10-CM | POA: Insufficient documentation

## 2013-08-29 DIAGNOSIS — Z8719 Personal history of other diseases of the digestive system: Secondary | ICD-10-CM | POA: Insufficient documentation

## 2013-08-29 DIAGNOSIS — M7989 Other specified soft tissue disorders: Secondary | ICD-10-CM

## 2013-08-29 LAB — D-DIMER, QUANTITATIVE: D-Dimer, Quant: 0.37 ug/mL-FEU (ref 0.00–0.48)

## 2013-08-29 LAB — BASIC METABOLIC PANEL
BUN: 7 mg/dL (ref 6–23)
CO2: 24 meq/L (ref 19–32)
CREATININE: 0.66 mg/dL (ref 0.50–1.10)
Calcium: 9.1 mg/dL (ref 8.4–10.5)
Chloride: 103 mEq/L (ref 96–112)
GFR calc Af Amer: >90 mL/min (ref >90)
GFR calc non Af Amer: >90 mL/min (ref >90)
Glucose, Bld: 97 mg/dL (ref 70–99)
Potassium: 3.8 mEq/L (ref 3.7–5.3)
Sodium: 140 mEq/L (ref 137–147)

## 2013-08-29 LAB — CBC
HCT: 41.3 % (ref 36.0–46.0)
HEMOGLOBIN: 14.8 g/dL (ref 12.0–15.0)
MCH: 37 pg — AB (ref 26.0–34.0)
MCHC: 35.8 g/dL (ref 30.0–36.0)
MCV: 103.3 fL — AB (ref 78.0–100.0)
Platelets: 224 10*3/uL (ref 150–400)
RBC: 4 MIL/uL (ref 3.87–5.11)
RDW: 12.8 % (ref 11.5–15.5)
WBC: 6.2 10*3/uL (ref 4.0–10.5)

## 2013-08-29 LAB — HEPATIC FUNCTION PANEL
ALBUMIN: 3.7 g/dL (ref 3.5–5.2)
ALT: 92 U/L — ABNORMAL HIGH (ref 0–35)
AST: 60 U/L — ABNORMAL HIGH (ref 0–37)
Alkaline Phosphatase: 75 U/L (ref 39–117)
Bilirubin, Direct: <0.2 mg/dL (ref 0.0–0.3)
TOTAL PROTEIN: 7.3 g/dL (ref 6.0–8.3)
Total Bilirubin: 0.7 mg/dL (ref 0.3–1.2)

## 2013-08-29 LAB — PRO B NATRIURETIC PEPTIDE: Pro B Natriuretic peptide (BNP): 67.4 pg/mL (ref 0–125)

## 2013-08-29 LAB — I-STAT TROPONIN, ED: Troponin i, poc: 0 ng/mL (ref 0.00–0.08)

## 2013-08-29 NOTE — ED Notes (Signed)
Hannah, PA-C, at the bedside.  

## 2013-08-29 NOTE — ED Notes (Signed)
Becky Estrada, [place], the patient is anxious and ready to leave.  Becky Estrada came to the bedside to see the patient.

## 2013-08-29 NOTE — ED Notes (Signed)
Called phlebotomy for D-dimer to be added.

## 2013-08-29 NOTE — Discharge Instructions (Signed)
Edema Edema is an abnormal build-up of fluids in tissues. Because this is partly dependent on gravity (water flows to the lowest place), it is more common in the legs and thighs (lower extremities). It is also common in the looser tissues, like around the eyes. Painless swelling of the feet and ankles is common and increases as a person ages. It may affect both legs and may include the calves or even thighs. When squeezed, the fluid may move out of the affected area and may leave a dent for a few moments. CAUSES   Prolonged standing or sitting in one place for extended periods of time. Movement helps pump tissue fluid into the veins, and absence of movement prevents this, resulting in edema.  Varicose veins. The valves in the veins do not work as well as they should. This causes fluid to leak into the tissues.  Fluid and salt overload.  Injury, burn, or surgery to the leg, ankle, or foot, may damage veins and allow fluid to leak out.  Sunburn damages vessels. Leaky vessels allow fluid to go out into the sunburned tissues.  Allergies (from insect bites or stings, medications or chemicals) cause swelling by allowing vessels to become leaky.  Protein in the blood helps keep fluid in your vessels. Low protein, as in malnutrition, allows fluid to leak out.  Hormonal changes, including pregnancy and menstruation, cause fluid retention. This fluid may leak out of vessels and cause edema.  Medications that cause fluid retention. Examples are sex hormones, blood pressure medications, steroid treatment, or anti-depressants.  Some illnesses cause edema, especially heart failure, kidney disease, or liver disease.  Surgery that cuts veins or lymph nodes, such as surgery done for the heart or for breast cancer, may result in edema. DIAGNOSIS  Your caregiver is usually easily able to determine what is causing your swelling (edema) by simply asking what is wrong (getting a history) and examining you (doing  a physical). Sometimes x-rays, EKG (electrocardiogram or heart tracing), and blood work may be done to evaluate for underlying medical illness. TREATMENT  General treatment includes:  Leg elevation (or elevation of the affected body part).  Restriction of fluid intake.  Prevention of fluid overload.  Compression of the affected body part. Compression with elastic bandages or support stockings squeezes the tissues, preventing fluid from entering and forcing it back into the blood vessels.  Diuretics (also called water pills or fluid pills) pull fluid out of your body in the form of increased urination. These are effective in reducing the swelling, but can have side effects and must be used only under your caregiver's supervision. Diuretics are appropriate only for some types of edema. The specific treatment can be directed at any underlying causes discovered. Heart, liver, or kidney disease should be treated appropriately. HOME CARE INSTRUCTIONS   Elevate the legs (or affected body part) above the level of the heart, while lying down.  Avoid sitting or standing still for prolonged periods of time.  Avoid putting anything directly under the knees when lying down, and do not wear constricting clothing or garters on the upper legs.  Exercising the legs causes the fluid to work back into the veins and lymphatic channels. This may help the swelling go down.  The pressure applied by elastic bandages or support stockings can help reduce ankle swelling.  A low-salt diet may help reduce fluid retention and decrease the ankle swelling.  Take any medications exactly as prescribed. SEEK MEDICAL CARE IF:  Your edema is   not responding to recommended treatments. SEEK IMMEDIATE MEDICAL CARE IF:   You develop shortness of breath or chest pain.  You cannot breathe when you lay down; or if, while lying down, you have to get up and go to the window to get your breath.  You are having increasing  swelling without relief from treatment.  You develop a fever over 102 F (38.9 C).  You develop pain or redness in the areas that are swollen.  Tell your caregiver right away if you have gained 03 lb/1.4 kg in 1 day or 05 lb/2.3 kg in a week. MAKE SURE YOU:   Understand these instructions.  Will watch your condition.  Will get help right away if you are not doing well or get worse. Document Released: 03/17/2005 Document Revised: 09/16/2011 Document Reviewed: 11/03/2007 Exit[employer] Patient Information 2014 [employer].  

## 2013-08-29 NOTE — ED Notes (Signed)
Discussed plan of care with Dahlia Client, PA-C.

## 2013-08-29 NOTE — ED Notes (Signed)
Called lab about new hepatic function panel order.

## 2013-08-29 NOTE — ED Provider Notes (Signed)
CSN: [id number]     Arrival date & time 08/29/13  1743 History   First MD Initiated Contact with Patient 08/29/13 2138     Chief Complaint  Patient presents with  . Leg Swelling  . Shortness of Breath     (Consider location/radiation/quality/duration/timing/severity/associated sxs/prior Treatment) HPI Comments: Patient is a 33 year old female with history of GERD and hypertension who presents today with leg swelling. She noticed the leg swelling around 3pm while talking to a Cabin crew as she was getting ready to leave work. Her left leg is mildly more swollen than her right, but both are swollen. She denies any pain to her extremities. No numbness weakness. Patient denies any chest pain, shortness of breath, fevers, chills, nausea, vomiting. No history of prior DVT or PE. She states she knows she drinks "more alcohol than I should", but is working on stopping drinking with her PCP. She has the mirena IUD. No long trips or recent surgeries.    The history is provided by the patient. No language interpreter was used.    Past Medical History  Diagnosis Date  . GERD (gastroesophageal reflux disease)   . Hypertension   . PID (acute pelvic inflammatory disease) 2003   Past Surgical History  Procedure Laterality Date  . Wisdom tooth extraction     Family History  Problem Relation Age of Onset  . Ovarian cancer      Grandmother  . Diabetes Father   . Kidney disease Father    History  Substance Use Topics  . Smoking status: Current Every Day Smoker -- 1.00 packs/day    Types: Cigarettes  . Smokeless tobacco: Never Used  . Alcohol Use: 2.0 oz/week    4 drink(s) per week   OB History   Grav Para Term Preterm Abortions TAB SAB Ect Mult Living                 Review of Systems  Constitutional: Negative for fever and chills.  Respiratory: Negative for chest tightness and shortness of breath.   Cardiovascular: Positive for leg swelling. Negative for chest pain.  Gastrointestinal:  Negative for nausea, vomiting and abdominal pain.  All other systems reviewed and are negative.     Allergies  Review of patient's allergies indicates no known allergies.  Home Medications   Prior to Admission medications   Medication Sig Start Date End Date Taking? Authorizing Provider  levonorgestrel (MIRENA) 20 MCG/24HR IUD 1 each by Intrauterine route once.      Historical Provider, MD  metoprolol succinate (TOPROL-XL) 100 MG 24 hr tablet Take 1 tablet (100 mg total) by mouth daily. Take with or immediately following a meal. 06/27/13   Nelwyn Salisbury, MD   BP 150/103  Pulse 66  Temp(Src) 97.7 F (36.5 C) (Oral)  Resp 12  SpO2 100% Physical Exam  Nursing note and vitals reviewed. Constitutional: She is oriented to person, place, and time. She appears well-developed and well-nourished. No distress.  HENT:  Head: Normocephalic and atraumatic.  Right Ear: External ear normal.  Left Ear: External ear normal.  Nose: Nose normal.  Mouth/Throat: Oropharynx is clear and moist.  Eyes: Conjunctivae are normal.  Neck: Normal range of motion.  Cardiovascular: Normal rate, regular rhythm, normal heart sounds, intact distal pulses and normal pulses.   Pulses:      Radial pulses are 2+ on the right side, and 2+ on the left side.       Dorsalis pedis pulses are 2+ on  the right side, and 2+ on the left side.       Posterior tibial pulses are 2+ on the right side, and 2+ on the left side.  Mild left sided leg swelling. No erythema, induration, break in skin.   Pulmonary/Chest: Effort normal and breath sounds normal. No stridor. No respiratory distress. She has no wheezes. She has no rales.  Abdominal: Soft. She exhibits no distension.  Musculoskeletal: Normal range of motion.  Neurological: She is alert and oriented to person, place, and time. She has normal strength.  Skin: Skin is warm and dry. She is not diaphoretic. No erythema.  Psychiatric: She has a normal mood and affect. Her  behavior is normal.    ED Course  Procedures (including critical care time) Labs Review Labs Reviewed  CBC - Abnormal; Notable for the following:    MCV 103.3 (*)    MCH 37.0 (*)    All other components within normal limits  HEPATIC FUNCTION PANEL - Abnormal; Notable for the following:    AST 60 (*)    ALT 92 (*)    All other components within normal limits  BASIC METABOLIC PANEL  PRO B NATRIURETIC PEPTIDE  D-DIMER, QUANTITATIVE  I-STAT TROPOININ, ED    Imaging Review Dg Chest 2 View  08/29/2013   CLINICAL DATA:  Leg swelling this afternoon.  Shortness of Breath.  EXAM: CHEST  2 VIEW  COMPARISON:  None.  FINDINGS: The heart size and mediastinal contours are within normal limits. Both lungs are clear. The visualized skeletal structures are unremarkable.  IMPRESSION: No active cardiopulmonary disease.   Electronically Signed   By: Amie Portlandavid  Ormond M.D.   On: 08/29/2013 20:04     EKG Interpretation   Date/Time:  Monday August 29 2013 18:10:40 EDT Ventricular Rate:  75 PR Interval:  182 QRS Duration: 82 QT Interval:  374 QTC Calculation: 417 R Axis:   72 Text Interpretation:  Normal sinus rhythm Normal ECG ED PHYSICIAN  INTERPRETATION AVAILABLE IN [facility] Confirmed by TEST, Record  ([id number]) on 08/31/2013 7:32:23 AM      MDM   Final diagnoses:  Leg swelling    Patient presents to ED for leg swelling left greater than right. D dimer negative. No other risk factor for DVT. BNP negative. Labs unremarkable. No concern for infectious process at this time. Likely benign cause of leg swelling. Patient encouraged to elevated legs. She will follow up with PCP. Return instructions given. Vital signs stable for discharge. Patient / Family / Caregiver informed of clinical course, understand medical decision-making process, and agree with plan.    Mora BellmanHannah S Lashelle Koy, PA-C 08/31/13 1318

## 2013-08-29 NOTE — ED Notes (Addendum)
Pt notes swelling in bilateral lower legs since this AM, sent by PCP to be evaluated. Pt reports she felt SOB yesterday, but denies at this time. PT noted to be tearful in triage, states "I just feel so unhealthy." Denies CP, N/V, diaphoresis. Lungs diminished bilaterally. NAD.

## 2013-08-29 NOTE — Telephone Encounter (Signed)
Patient Information:  Caller Name: Derisha  Phone: [phone]  Patient: Becky Estrada, [place]  Gender: Female  DOB: 1981-02-05  Age: 33 Years  PCP: Gershon Crane [facility])  Pregnant: No  Office Follow Up:  Does the office need to follow up with this patient?: No  Instructions For The Office: N/A  RN Note:  Advised to go to ED at [facility] now for eval and that someone else should drive. (did not speak with office staff prior to sending to ED due to after 5pm now and office closed). Pt agreed to plan.  Symptoms  Reason For Call & Symptoms: Both ankles swollen but L>R; onset today. never had this symptom before. The edema is not pitting and does not extend up past the ankles. Denies any other sxs. Does state that she "drinks more than I should" and is concerned about what the alcohol may be doing to her health. States does she has been gaining weight recently and confirms this is primarily in her abdomen.  Reviewed Health History In EMR: Yes  Reviewed Medications In EMR: Yes  Reviewed Allergies In EMR: Yes  Reviewed Surgeries / Procedures: Yes  Date of Onset of Symptoms: 08/29/2013 OB / GYN:  LMP: Unknown  Guideline(s) Used:  Leg Swelling and Edema  Disposition Per Guideline:   Go to ED Now (or to Office with PCP Approval)  Reason For Disposition Reached:   Thigh, calf, or ankle swelling in both legs, but one side is definitely more swollen  Advice Given:  N/A  Patient Will Follow Care Advice:  YES

## 2013-08-30 ENCOUNTER — Telehealth: Payer: Self-pay | Admitting: Family Medicine

## 2013-08-30 NOTE — Telephone Encounter (Signed)
Yes, okay to schedule.

## 2013-08-30 NOTE — Telephone Encounter (Signed)
Noted  

## 2013-08-30 NOTE — Telephone Encounter (Signed)
Pt has been sch for 09-05-13 °

## 2013-08-30 NOTE — Telephone Encounter (Signed)
Pt went to  and needs er fup this week. Can I create 30 min slot

## 2013-09-01 NOTE — ED Provider Notes (Signed)
Medical screening examination/treatment/procedure(s) were performed by non-physician practitioner and as supervising physician I was immediately available for consultation/collaboration.   EKG Interpretation   Date/Time:  Monday August 29 2013 18:10:40 EDT Ventricular Rate:  75 PR Interval:  182 QRS Duration: 82 QT Interval:  374 QTC Calculation: 417 R Axis:   72 Text Interpretation:  Normal sinus rhythm Normal ECG ED PHYSICIAN  INTERPRETATION AVAILABLE IN [facility] Confirmed by TEST, Record  ([id number]) on 08/31/2013 7:32:23 AM       Geoffery Lyons, MD 09/01/13 [phone]

## 2013-09-05 ENCOUNTER — Ambulatory Visit (INDEPENDENT_AMBULATORY_CARE_PROVIDER_SITE_OTHER): Payer: Managed Care, Other (non HMO) | Admitting: Family Medicine

## 2013-09-05 ENCOUNTER — Telehealth: Payer: Self-pay | Admitting: Family Medicine

## 2013-09-05 ENCOUNTER — Encounter: Payer: Self-pay | Admitting: Family Medicine

## 2013-09-05 VITALS — BP 152/104 | HR 65 | Temp 98.4°F | Ht 63.0 in | Wt 194.0 lb

## 2013-09-05 DIAGNOSIS — R600 Localized edema: Secondary | ICD-10-CM

## 2013-09-05 DIAGNOSIS — F1010 Alcohol abuse, uncomplicated: Secondary | ICD-10-CM

## 2013-09-05 DIAGNOSIS — I10 Essential (primary) hypertension: Secondary | ICD-10-CM

## 2013-09-05 DIAGNOSIS — R609 Edema, unspecified: Secondary | ICD-10-CM

## 2013-09-05 MED ORDER — HYDROCHLOROTHIAZIDE 25 MG PO TABS: 25.0000 mg | ORAL_TABLET | Freq: Every day | ORAL | Status: DC

## 2013-09-05 NOTE — Telephone Encounter (Signed)
Relevant patient education assigned to patient using Emmi. ° °

## 2013-09-05 NOTE — Progress Notes (Signed)
Pre visit review using our clinic review tool, if applicable. No additional management support is needed unless otherwise documented below in the visit note. 

## 2013-09-05 NOTE — Progress Notes (Signed)
   Subjective:    Patient ID: Becky Estrada, female    DOB: 30-Mar-1981, 33 y.o.   MRN: [id number]  HPI Here to follow up an ER visit on 08-29-13 for swelling in both lower legs. No pain or redness. No SOB. Her workup was normal except for mildly elevated transaminases. No treatment was offered and she was told to see [place]. Since then the swelling has disappeared. She has been working to slowly taper off alcohol. She is now going 2 days a week with no alcohol at all, and on the days she does drink she has greatly reduced the amount. Her goal is to stop completely over the next month.    Review of Systems  Constitutional: Negative.   Respiratory: Negative.   Cardiovascular: Positive for leg swelling. Negative for chest pain and palpitations.       Objective:   Physical Exam  Constitutional: She appears well-developed and well-nourished.  Cardiovascular: Normal rate, regular rhythm, normal heart sounds and intact distal pulses.   Pulmonary/Chest: Effort normal and breath sounds normal. No respiratory distress. She has no wheezes. She has no rales.  Musculoskeletal:  Trace edema in the feet           Assessment & Plan:  Her leg edema was probably a due to a combination of alcohol use and the hot weather we have been having lately. Her BP is up so we will add HCTZ to her metoprolol. She will continue her taper off alcohol. Recheck in one month

## 2013-12-27 ENCOUNTER — Emergency Department (HOSPITAL_COMMUNITY)
Admission: EM | Admit: 2013-12-27 | Discharge: 2013-12-27 | Disposition: A | Discharge status: Home / Self Care | Payer: Managed Care, Other (non HMO) | Source: Home / Self Care | Attending: Family Medicine | Admitting: Family Medicine

## 2013-12-27 ENCOUNTER — Emergency Department (INDEPENDENT_AMBULATORY_CARE_PROVIDER_SITE_OTHER): Payer: Managed Care, Other (non HMO)

## 2013-12-27 ENCOUNTER — Encounter (HOSPITAL_COMMUNITY): Payer: Self-pay | Admitting: Emergency Medicine

## 2013-12-27 DIAGNOSIS — S92912A Unspecified fracture of left toe(s), initial encounter for closed fracture: Secondary | ICD-10-CM

## 2013-12-27 DIAGNOSIS — S92919A Unspecified fracture of unspecified toe(s), initial encounter for closed fracture: Secondary | ICD-10-CM

## 2013-12-27 DIAGNOSIS — X58XXXA Exposure to other specified factors, initial encounter: Secondary | ICD-10-CM

## 2013-12-27 NOTE — ED Provider Notes (Signed)
Cassell SmilesBrianne K Hershberger is a 33 y.o. female who presents to Urgent Care today for left great toe injury. Patient suffered an injury at her grandmothers house last week when she misstepped stepped into a hole. She notes pain at the left great toe especially at the MTP and interphalangeal joints. She notes pain and swelling and bruising. The pain is worse with activity and is moderate. She has not tried any medications yet. She notes that she has to walk with a limp.   Past Medical History  Diagnosis Date  . GERD (gastroesophageal reflux disease)   . Hypertension   . PID (acute pelvic inflammatory disease) 2003   History  Substance Use Topics  . Smoking status: Current Every Day Smoker -- 1.00 packs/day    Types: Cigarettes  . Smokeless tobacco: Never Used  . Alcohol Use: 2.0 oz/week    4 drink(s) per week   ROS as above Medications: No current facility-administered medications for this encounter.   Current Outpatient Prescriptions  Medication Sig Dispense Refill  . hydrochlorothiazide (HYDRODIURIL) 25 MG tablet Take 1 tablet (25 mg total) by mouth daily.  90 tablet  3  . metoprolol succinate (TOPROL-XL) 100 MG 24 hr tablet Take 1 tablet (100 mg total) by mouth daily. Take with or immediately following a meal.  90 tablet  3  . levonorgestrel (MIRENA) 20 MCG/24HR IUD 1 each by Intrauterine route once.          Exam:  BP 146/103  Pulse 80  Temp(Src) 97.7 F (36.5 C) (Oral)  Resp 16  SpO2 99%  Gen: Well NAD Left foot: Swelling and ecchymosis and tenderness along the left great toe. No angular deformity. Pulses Refill and sensation are intact. Motion decreased due to pain.  No results found for this or any previous visit (from the past 24 hour(s)). Dg Foot Complete Left  12/27/2013   CLINICAL DATA:  Stepped off deck and it tree stump. Pain great toe, second and third toes.  EXAM: LEFT FOOT - COMPLETE 3+ VIEW  COMPARISON:  None.  FINDINGS: The joints of the foot are located.  On the AP  and oblique views, there is a subtle, obliquely-oriented lucency through the distal 2/3 of the proximal phalanx of the left great toe, suspicious for an acute, nondisplaced fracture.  No additional potential fractures are seen. Joint spaces appear within normal limits. No radiopaque foreign body. No discrete soft tissue swelling appreciated.  IMPRESSION: Probable nondisplaced acute oblique fracture proximal phalanx left great toe.   Electronically Signed   By: Britta MccreedySusan  Turner M.D.   On: 12/27/2013 15:46    Assessment and Plan: 33 y.o. female with left great toe fracture. Plan for postoperative shoe Tylenol ibuprofen and referral to podiatry.  Discussed warning signs or symptoms. Please see discharge instructions. Patient expresses understanding.     Rodolph BongEvan S Kalei Mckillop, MD 12/27/13 [phone]

## 2013-12-27 NOTE — ED Notes (Addendum)
Reports stepping down off porch into a tree stump with a whole injuring the left great toe x 1 wk ago.  States "still having pain".  Mild relief with otc meds.

## 2013-12-27 NOTE — Discharge Instructions (Signed)
Thank you for coming in today. Use the postoperative shoe Followup with podiatry  Toe Fracture Your caregiver has diagnosed you as having a fractured toe. A toe fracture is a break in the bone of a toe. "Buddy taping" is a way of splinting your broken toe, by taping the broken toe to the toe next to it. This "buddy taping" will keep the injured toe from moving beyond normal range of motion. Buddy taping also helps the toe heal in a more normal alignment. It may take 6 to 8 weeks for the toe injury to heal. HOME CARE INSTRUCTIONS   Leave your toes taped together for as long as directed by your caregiver or until you see a doctor for a follow-up examination. You can change the tape after bathing. Always use a small piece of gauze or cotton between the toes when taping them together. This will help the skin stay dry and prevent infection.  Apply ice to the injury for 15-20 minutes each hour while awake for the first 2 days. Put the ice in a plastic bag and place a towel between the bag of ice and your skin.  After the first 2 days, apply heat to the injured area. Use heat for the next 2 to 3 days. Place a heating pad on the foot or soak the foot in warm water as directed by your caregiver.  Keep your foot elevated as much as possible to lessen swelling.  Wear sturdy, supportive shoes. The shoes should not pinch the toes or fit tightly against the toes.  Your caregiver may prescribe a rigid shoe if your foot is very swollen.  Your may be given crutches if the pain is too great and it hurts too much to walk.  Only take over-the-counter or prescription medicines for pain, discomfort, or fever as directed by your caregiver.  If your caregiver has given you a follow-up appointment, it is very important to keep that appointment. Not keeping the appointment could result in a chronic or permanent injury, pain, and disability. If there is any problem keeping the appointment, you must call back to this  facility for assistance. SEEK MEDICAL CARE IF:   You have increased pain or swelling, not relieved with medications.  The pain does not get better after 1 week.  Your injured toe is cold when the others are warm. SEEK IMMEDIATE MEDICAL CARE IF:   The toe becomes cold, numb, or white.  The toe becomes hot (inflamed) and red. Document Released: 03/14/2000 Document Revised: 06/09/2011 Document Reviewed: 11/01/2007 [facility] CenterExitCare Patient Information 2015 [employer], [place]. This information is not intended to replace advice given to you by your health care provider. Make sure you discuss any questions you have with your health care provider.

## 2013-12-30 ENCOUNTER — Encounter: Payer: Self-pay | Admitting: Family Medicine

## 2013-12-30 ENCOUNTER — Ambulatory Visit (INDEPENDENT_AMBULATORY_CARE_PROVIDER_SITE_OTHER): Payer: Managed Care, Other (non HMO) | Admitting: Family Medicine

## 2013-12-30 VITALS — BP 125/88 | HR 83 | Temp 97.6°F | Ht 63.0 in | Wt 195.0 lb

## 2013-12-30 DIAGNOSIS — I10 Essential (primary) hypertension: Secondary | ICD-10-CM

## 2013-12-30 DIAGNOSIS — L739 Follicular disorder, unspecified: Secondary | ICD-10-CM

## 2013-12-30 MED ORDER — DOXYCYCLINE HYCLATE 100 MG PO CAPS: 100.0000 mg | ORAL_CAPSULE | Freq: Two times a day (BID) | ORAL | Status: AC

## 2013-12-30 NOTE — Progress Notes (Signed)
   Subjective:    Patient ID: Becky Estrada, female    DOB: December 15, 1980, 33 y.o.   MRN: [id number]  HPI Here for 2 weeks of painful bumps in the left armpit. No fever. Also she wants to check her BP. She was at an urgent care office last week with a broken toe, and her BP was high then.    Review of Systems  Constitutional: Negative.   Respiratory: Negative.   Cardiovascular: Negative.   Skin: Positive for rash.  Neurological: Negative.        Objective:   Physical Exam  Constitutional: She appears well-developed and well-nourished.  Cardiovascular: Normal rate, regular rhythm, normal heart sounds and intact distal pulses.   Pulmonary/Chest: Effort normal and breath sounds normal.  Skin:  The left axilla has multiple small pustules and one large tender boil           Assessment & Plan:  Treat the folliculitis with Doxycycline. Her HTN seems to be stable

## 2013-12-30 NOTE — Progress Notes (Signed)
Pre visit review using our clinic review tool, if applicable. No additional management support is needed unless otherwise documented below in the visit note. 

## 2014-01-02 ENCOUNTER — Telehealth: Payer: Self-pay | Admitting: Family Medicine

## 2014-01-02 NOTE — Telephone Encounter (Signed)
emmi emailed °

## 2014-01-16 ENCOUNTER — Ambulatory Visit: Payer: Self-pay

## 2014-01-16 ENCOUNTER — Encounter: Payer: Self-pay | Admitting: Podiatry

## 2014-03-30 NOTE — Progress Notes (Signed)
This encounter was created in error - please disregard.

## 2014-07-26 ENCOUNTER — Other Ambulatory Visit: Payer: Self-pay | Admitting: Family Medicine

## 2014-10-04 ENCOUNTER — Other Ambulatory Visit: Payer: Self-pay | Admitting: Family Medicine

## 2014-10-04 ENCOUNTER — Other Ambulatory Visit: Payer: Self-pay | Admitting: Family

## 2014-11-20 IMAGING — CR DG FOOT COMPLETE 3+V*L*
3 series · 3 of 3 positions shown · non-contrast
Comparison: None.

CLINICAL DATA: Stepped off deck and it tree stump. Pain great toe,
second and third toes.

EXAM:
LEFT FOOT - COMPLETE 3+ VIEW

[foot ap]
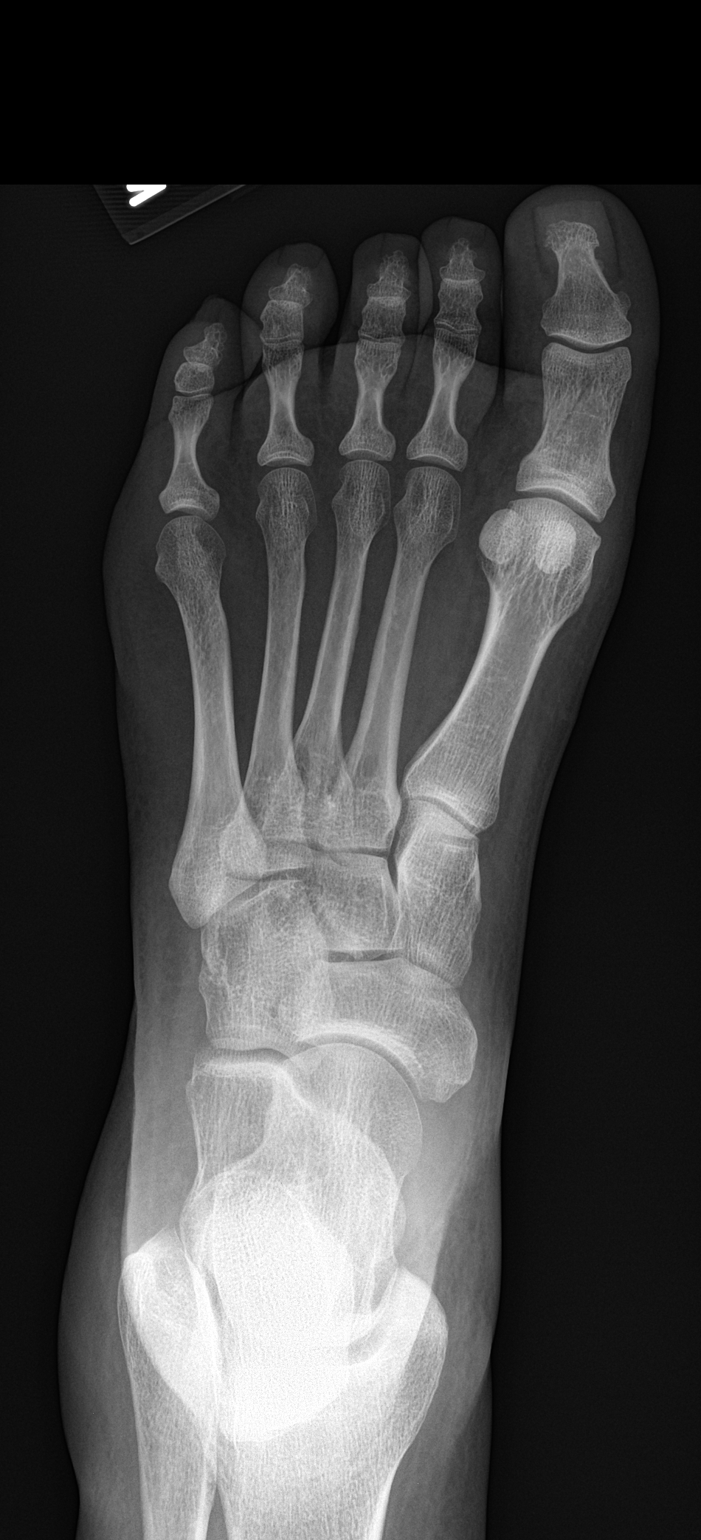

[foot obl]
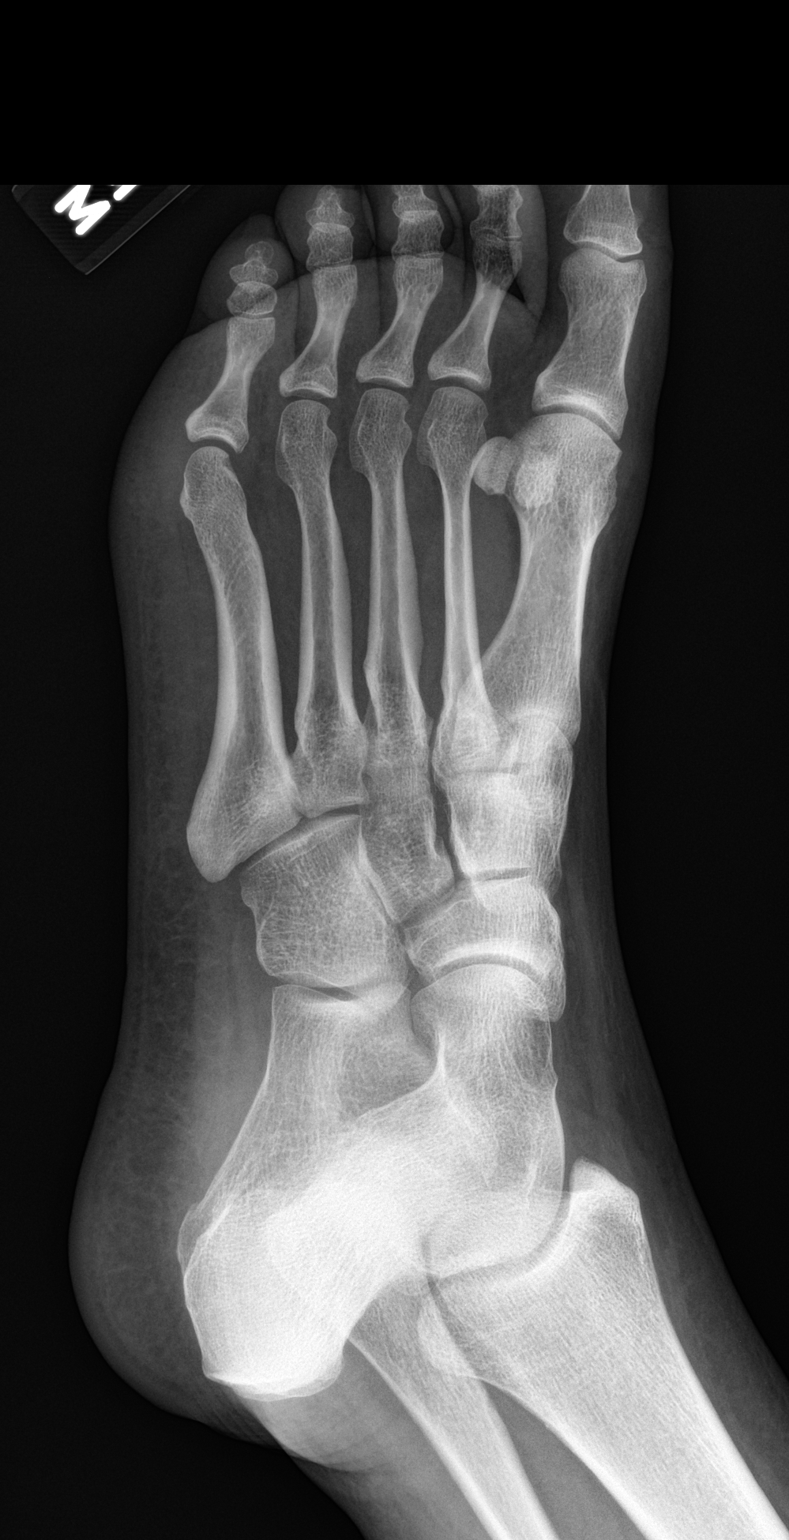

[foot lat]
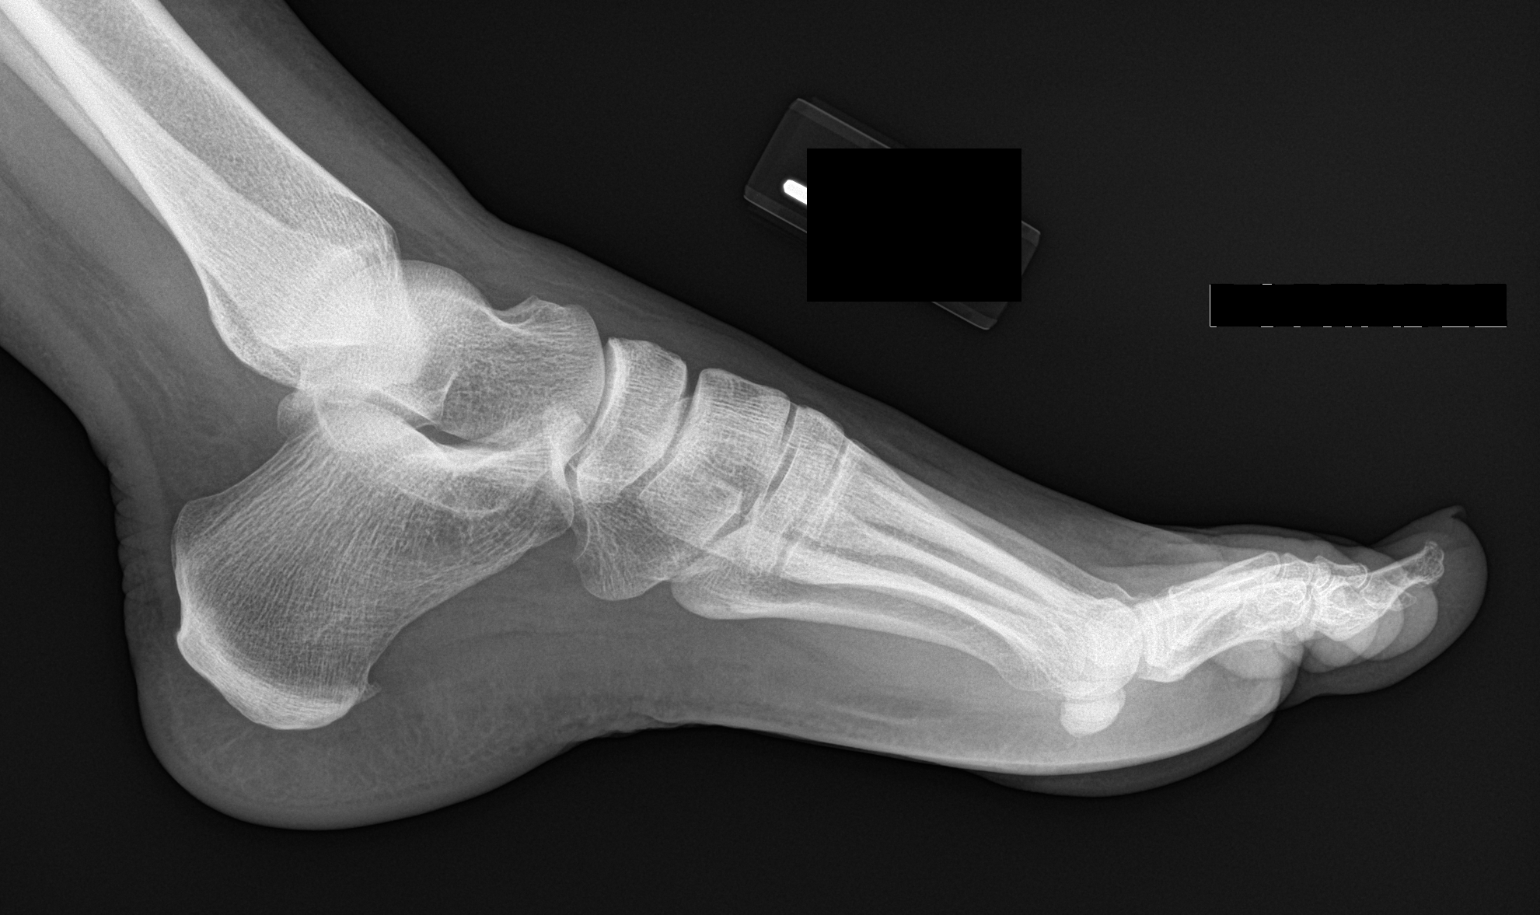

[3 of 3 positions shown; findings below may reference images not displayed]

FINDINGS: The joints of the foot are located.

On the AP and oblique views, there is a subtle, obliquely-oriented
lucency through the distal [DATE] of the proximal phalanx of the left
great toe, suspicious for an acute, nondisplaced fracture.

No additional potential fractures are seen. Joint spaces appear
within normal limits. No radiopaque foreign body. No discrete soft
tissue swelling appreciated.
IMPRESSION: Probable nondisplaced acute oblique fracture proximal phalanx left
great toe.

## 2015-01-22 ENCOUNTER — Other Ambulatory Visit: Payer: Self-pay | Admitting: Family Medicine

## 2015-02-07 ENCOUNTER — Other Ambulatory Visit: Payer: Self-pay | Admitting: Family Medicine

## 2015-04-15 ENCOUNTER — Other Ambulatory Visit: Payer: Self-pay | Admitting: Family Medicine

## 2015-04-17 ENCOUNTER — Other Ambulatory Visit: Payer: Self-pay | Admitting: Family Medicine

## 2015-04-28 ENCOUNTER — Other Ambulatory Visit: Payer: Self-pay | Admitting: Family Medicine

## 2015-05-17 ENCOUNTER — Other Ambulatory Visit: Payer: Self-pay | Admitting: Family Medicine

## 2015-05-17 NOTE — Telephone Encounter (Signed)
Pt request refill of the following: metoprolol succinate (TOPROL-XL) 100 MG 24 hr tablet   Phamacy: [facility]

## 2015-05-18 NOTE — Telephone Encounter (Signed)
Left a message for pt to return my call. 

## 2015-05-18 NOTE — Telephone Encounter (Signed)
Pt will call us back if she needs the medication before her appointment here on 2/20/217.

## 2015-05-21 ENCOUNTER — Encounter: Payer: Self-pay | Admitting: Family Medicine

## 2015-05-21 ENCOUNTER — Ambulatory Visit (INDEPENDENT_AMBULATORY_CARE_PROVIDER_SITE_OTHER): Payer: Managed Care, Other (non HMO) | Admitting: Family Medicine

## 2015-05-21 VITALS — BP 136/94 | HR 66 | Temp 97.5°F

## 2015-05-21 DIAGNOSIS — I10 Essential (primary) hypertension: Secondary | ICD-10-CM | POA: Diagnosis not present

## 2015-05-21 DIAGNOSIS — S76911A Strain of unspecified muscles, fascia and tendons at thigh level, right thigh, initial encounter: Secondary | ICD-10-CM

## 2015-05-21 DIAGNOSIS — F411 Generalized anxiety disorder: Secondary | ICD-10-CM | POA: Diagnosis not present

## 2015-05-21 DIAGNOSIS — M79672 Pain in left foot: Secondary | ICD-10-CM

## 2015-05-21 MED ORDER — LORAZEPAM 1 MG PO TABS: 1.0000 mg | ORAL_TABLET | Freq: Four times a day (QID) | ORAL | Status: DC | PRN

## 2015-05-21 MED ORDER — HYDROCHLOROTHIAZIDE 25 MG PO TABS: ORAL_TABLET | ORAL | Status: DC

## 2015-05-21 MED ORDER — METOPROLOL SUCCINATE ER 100 MG PO TB24: ORAL_TABLET | ORAL | Status: DC

## 2015-05-21 NOTE — Progress Notes (Signed)
   Subjective:    Patient ID: Becky Estrada, female    DOB: [birth year], 35 y.o.   MRN: [id number]  HPI Here for several issues. First her HTN has been stable but she needs refills. Second she has had pain in the left lateral foot for 6 months. Third she has had a pain in the right anterior thigh for 2 weeks, no recent trauma. Fourth she will be flying to [place] soon and she gets extremely anxious when flying.    Review of Systems  Constitutional: Negative.   HENT: Negative.   Respiratory: Negative.   Cardiovascular: Negative.   Musculoskeletal: Positive for myalgias.  Psychiatric/Behavioral: Negative for dysphoric mood.       Objective:   Physical Exam  Constitutional: She is oriented to person, place, and time. She appears well-developed and well-nourished.  Neck: Neck supple. No thyromegaly present.  Cardiovascular: Normal rate, regular rhythm, normal heart sounds and intact distal pulses.   Pulmonary/Chest: Effort normal and breath sounds normal.  Musculoskeletal:  Left foot is normal on exam. She is tender in left anterior thigh, no masses   Lymphadenopathy:    She has no cervical adenopathy.  Neurological: She is alert and oriented to person, place, and time.  Psychiatric: She has a normal mood and affect. Her behavior is normal.          Assessment & Plan:  Her HTN is stable. For the travel anxiety, try Lorazepam. For the foot pain, refer to Podiatry. For the hip flexor strain, she can do stretches, use heat, and a take Advil prn.

## 2015-05-21 NOTE — Progress Notes (Signed)
Pre visit review using our clinic review tool, if applicable. No additional management support is needed unless otherwise documented below in the visit note. Pt declined to weigh 

## 2015-06-05 ENCOUNTER — Encounter: Payer: Self-pay | Admitting: Podiatry

## 2015-06-05 ENCOUNTER — Ambulatory Visit (INDEPENDENT_AMBULATORY_CARE_PROVIDER_SITE_OTHER): Payer: Managed Care, Other (non HMO) | Admitting: Podiatry

## 2015-06-05 ENCOUNTER — Ambulatory Visit (INDEPENDENT_AMBULATORY_CARE_PROVIDER_SITE_OTHER): Payer: Managed Care, Other (non HMO)

## 2015-06-05 ENCOUNTER — Ambulatory Visit: Payer: Self-pay

## 2015-06-05 VITALS — BP 135/90 | HR 85 | Resp 16 | Ht 63.0 in | Wt 210.0 lb

## 2015-06-05 DIAGNOSIS — M722 Plantar fascial fibromatosis: Secondary | ICD-10-CM

## 2015-06-05 DIAGNOSIS — M779 Enthesopathy, unspecified: Secondary | ICD-10-CM | POA: Diagnosis not present

## 2015-06-05 DIAGNOSIS — M79671 Pain in right foot: Secondary | ICD-10-CM | POA: Diagnosis not present

## 2015-06-05 DIAGNOSIS — M79672 Pain in left foot: Secondary | ICD-10-CM | POA: Diagnosis not present

## 2015-06-05 MED ORDER — TRIAMCINOLONE ACETONIDE 10 MG/ML IJ SUSP
10.0000 mg | Freq: Once | INTRAMUSCULAR | Status: AC
  Administered 2015-06-05: 10 mg

## 2015-06-05 MED ORDER — PREDNISONE 10 MG PO TABS: ORAL_TABLET | ORAL | Status: DC

## 2015-06-05 NOTE — Progress Notes (Signed)
   Subjective:    Patient ID: Becky Estrada, female    DOB: 10/21/1980, 35 y.o.   MRN: [id number]  HPI Patient presents with bilateral foot pain. Left foot; back of heel; lateral side & dorsal-near ankle joint. On the Right foot-heel; pt stated, "has more pain in the morning"; x1 yr.   Review of Systems  Constitutional: Positive for unexpected weight change.  Gastrointestinal: Positive for abdominal distention.  Musculoskeletal: Positive for gait problem.  All other systems reviewed and are negative.      Objective:   Physical Exam        Assessment & Plan:

## 2015-06-05 NOTE — Patient Instructions (Signed)
Achilles Tendinitis  with Rehab Achilles tendinitis is a disorder of the Achilles tendon. The Achilles tendon connects the large calf muscles (Gastrocnemius and Soleus) to the heel bone (calcaneus). This tendon is sometimes called the heel cord. It is important for pushing-off and standing on your toes and is important for walking, running, or jumping. Tendinitis is often caused by overuse and repetitive microtrauma. SYMPTOMS  Pain, tenderness, swelling, warmth, and redness may occur over the Achilles tendon even at rest.  Pain with pushing off, or flexing or extending the ankle.  Pain that is worsened after or during activity. CAUSES   Overuse sometimes seen with rapid increase in exercise programs or in sports requiring running and jumping.  Poor physical conditioning (strength and flexibility or endurance).  Running sports, especially training running down hills.  Inadequate warm-up before practice or play or failure to stretch before participation.  Injury to the tendon. PREVENTION   Warm up and stretch before practice or competition.  Allow time for adequate rest and recovery between practices and competition.  Keep up conditioning.  Keep up ankle and leg flexibility.  Improve or keep muscle strength and endurance.  Improve cardiovascular fitness.  Use proper technique.  Use proper equipment (shoes, skates).  To help prevent recurrence, taping, protective strapping, or an adhesive bandage may be recommended for several weeks after healing is complete. PROGNOSIS   Recovery may take weeks to several months to heal.  Longer recovery is expected if symptoms have been prolonged.  Recovery is usually quicker if the inflammation is due to a direct blow as compared with overuse or sudden strain. RELATED COMPLICATIONS   Healing time will be prolonged if the condition is not correctly treated. The injury must be given plenty of time to heal.  Symptoms can reoccur if  activity is resumed too soon.  Untreated, tendinitis may increase the risk of tendon rupture requiring additional time for recovery and possibly surgery. TREATMENT   The first treatment consists of rest anti-inflammatory medication, and ice to relieve the pain.  Stretching and strengthening exercises after resolution of pain will likely help reduce the risk of recurrence. Referral to a physical therapist or athletic trainer for further evaluation and treatment may be helpful.  A walking boot or cast may be recommended to rest the Achilles tendon. This can help break the cycle of inflammation and microtrauma.  Arch supports (orthotics) may be prescribed or recommended by your caregiver as an adjunct to therapy and rest.  Surgery to remove the inflamed tendon lining or degenerated tendon tissue is rarely necessary and has shown less than predictable results. MEDICATION   Nonsteroidal anti-inflammatory medications, such as aspirin and ibuprofen, may be used for pain and inflammation relief. Do not take within 7 days before surgery. Take these as directed by your caregiver. Contact your caregiver immediately if any bleeding, stomach upset, or signs of allergic reaction occur. Other minor pain relievers, such as acetaminophen, may also be used.  Pain relievers may be prescribed as necessary by your caregiver. Do not take prescription pain medication for longer than 4 to 7 days. Use only as directed and only as much as you need.  Cortisone injections are rarely indicated. Cortisone injections may weaken tendons and predispose to rupture. It is better to give the condition more time to heal than to use them. HEAT AND COLD  Cold is used to relieve pain and reduce inflammation for acute and chronic Achilles tendinitis. Cold should be applied for 10 to 15 minutes   every 2 to 3 hours for inflammation and pain and immediately after any activity that aggravates your symptoms. Use ice packs or an ice  massage.  Heat may be used before performing stretching and strengthening activities prescribed by your caregiver. Use a heat pack or a warm soak. SEEK MEDICAL CARE IF:  Symptoms get worse or do not improve in 2 weeks despite treatment.  New, unexplained symptoms develop. Drugs used in treatment may produce side effects.  EXERCISES:  RANGE OF MOTION (ROM) AND STRETCHING EXERCISES - Achilles Tendinitis  These exercises may help you when beginning to rehabilitate your injury. Your symptoms may resolve with or without further involvement from your physician, physical therapist or athletic trainer. While completing these exercises, remember:   Restoring tissue flexibility helps normal motion to return to the joints. This allows healthier, less painful movement and activity.  An effective stretch should be held for at least 30 seconds.  A stretch should never be painful. You should only feel a gentle lengthening or release in the stretched tissue.  STRETCH  Gastroc, Standing   Place hands on wall.  Extend right / left leg, keeping the front knee somewhat bent.  Slightly point your toes inward on your back foot.  Keeping your right / left heel on the floor and your knee straight, shift your weight toward the wall, not allowing your back to arch.  You should feel a gentle stretch in the right / left calf. Hold this position for 10 seconds. Repeat 3 times. Complete this stretch 2 times per day.  STRETCH  Soleus, Standing   Place hands on wall.  Extend right / left leg, keeping the other knee somewhat bent.  Slightly point your toes inward on your back foot.  Keep your right / left heel on the floor, bend your back knee, and slightly shift your weight over the back leg so that you feel a gentle stretch deep in your back calf.  Hold this position for 10 seconds. Repeat 3 times. Complete this stretch 2 times per day.  STRETCH  Gastrocsoleus, Standing  Note: This exercise can place  a lot of stress on your foot and ankle. Please complete this exercise only if specifically instructed by your caregiver.   Place the ball of your right / left foot on a step, keeping your other foot firmly on the same step.  Hold on to the wall or a rail for balance.  Slowly lift your other foot, allowing your body weight to press your heel down over the edge of the step.  You should feel a stretch in your right / left calf.  Hold this position for 10 seconds.  Repeat this exercise with a slight bend in your knee. Repeat 3 times. Complete this stretch 2 times per day.   STRENGTHENING EXERCISES - Achilles Tendinitis These exercises may help you when beginning to rehabilitate your injury. They may resolve your symptoms with or without further involvement from your physician, physical therapist or athletic trainer. While completing these exercises, remember:   Muscles can gain both the endurance and the strength needed for everyday activities through controlled exercises.  Complete these exercises as instructed by your physician, physical therapist or athletic trainer. Progress the resistance and repetitions only as guided.  You may experience muscle soreness or fatigue, but the pain or discomfort you are trying to eliminate should never worsen during these exercises. If this pain does worsen, stop and make certain you are following the directions exactly. If   the pain is still present after adjustments, discontinue the exercise until you can discuss the trouble with your clinician.  STRENGTH - Plantar-flexors   Sit with your right / left leg extended. Holding onto both ends of a rubber exercise band/tubing, loop it around the ball of your foot. Keep a slight tension in the band.  Slowly push your toes away from you, pointing them downward.  Hold this position for 10 seconds. Return slowly, controlling the tension in the band/tubing. Repeat 3 times. Complete this exercise 2 times per day.     STRENGTH - Plantar-flexors   Stand with your feet shoulder width apart. Steady yourself with a wall or table using as little support as needed.  Keeping your weight evenly spread over the width of your feet, rise up on your toes.*  Hold this position for 10 seconds. Repeat 3 times. Complete this exercise 2 times per day.  *If this is too easy, shift your weight toward your right / left leg until you feel challenged. Ultimately, you may be asked to do this exercise with your right / left foot only.  STRENGTH  Plantar-flexors, Eccentric  Note: This exercise can place a lot of stress on your foot and ankle. Please complete this exercise only if specifically instructed by your caregiver.   Place the balls of your feet on a step. With your hands, use only enough support from a wall or rail to keep your balance.  Keep your knees straight and rise up on your toes.  Slowly shift your weight entirely to your right / left toes and pick up your opposite foot. Gently and with controlled movement, lower your weight through your right / left foot so that your heel drops below the level of the step. You will feel a slight stretch in the back of your calf at the end position.  Use the healthy leg to help rise up onto the balls of both feet, then lower weight only on the right / left leg again. Build up to 15 repetitions. Then progress to 3 consecutive sets of 15 repetitions.*  After completing the above exercise, complete the same exercise with a slight knee bend (about 30 degrees). Again, build up to 15 repetitions. Then progress to 3 consecutive sets of 15 repetitions.* Perform this exercise 2 times per day.  *When you easily complete 3 sets of 15, your physician, physical therapist or athletic trainer may advise you to add resistance by wearing a backpack filled with additional weight.  STRENGTH - Plantar Flexors, Seated   Sit on a chair that allows your feet to rest flat on the ground. If  necessary, sit at the edge of the chair.  Keeping your toes firmly on the ground, lift your right / left heel as far as you can without increasing any discomfort in your ankle. Repeat 3 times. Complete this exercise 2 times a day.    Plantar Fasciitis (Heel Spur Syndrome) with Rehab The plantar fascia is a fibrous, ligament-like, soft-tissue structure that spans the bottom of the foot. Plantar fasciitis is a condition that causes pain in the foot due to inflammation of the tissue. SYMPTOMS   Pain and tenderness on the underneath side of the foot.  Pain that worsens with standing or walking. CAUSES  Plantar fasciitis is caused by irritation and injury to the plantar fascia on the underneath side of the foot. Common mechanisms of injury include:  Direct trauma to bottom of the foot.  Damage to a small   nerve that runs under the foot where the main fascia attaches to the heel bone.  Stress placed on the plantar fascia due to bone spurs. RISK INCREASES WITH:   Activities that place stress on the plantar fascia (running, jumping, pivoting, or cutting).  Poor strength and flexibility.  Improperly fitted shoes.  Tight calf muscles.  Flat feet.  Failure to warm-up properly before activity.  Obesity. PREVENTION  Warm up and stretch properly before activity.  Allow for adequate recovery between workouts.  Maintain physical fitness:  Strength, flexibility, and endurance.  Cardiovascular fitness.  Maintain a health body weight.  Avoid stress on the plantar fascia.  Wear properly fitted shoes, including arch supports for individuals who have flat feet.  PROGNOSIS  If treated properly, then the symptoms of plantar fasciitis usually resolve without surgery. However, occasionally surgery is necessary.  RELATED COMPLICATIONS   Recurrent symptoms that may result in a chronic condition.  Problems of the lower back that are caused by compensating for the injury, such as  limping.  Pain or weakness of the foot during push-off following surgery.  Chronic inflammation, scarring, and partial or complete fascia tear, occurring more often from repeated injections.  TREATMENT  Treatment initially involves the use of ice and medication to help reduce pain and inflammation. The use of strengthening and stretching exercises may help reduce pain with activity, especially stretches of the Achilles tendon. These exercises may be performed at home or with a therapist. Your caregiver may recommend that you use heel cups of arch supports to help reduce stress on the plantar fascia. Occasionally, corticosteroid injections are given to reduce inflammation. If symptoms persist for greater than 6 months despite non-surgical (conservative), then surgery may be recommended.   MEDICATION   If pain medication is necessary, then nonsteroidal anti-inflammatory medications, such as aspirin and ibuprofen, or other minor pain relievers, such as acetaminophen, are often recommended.  Do not take pain medication within 7 days before surgery.  Prescription pain relievers may be given if deemed necessary by your caregiver. Use only as directed and only as much as you need.  Corticosteroid injections may be given by your caregiver. These injections should be reserved for the most serious cases, because they may only be given a certain number of times.  HEAT AND COLD  Cold treatment (icing) relieves pain and reduces inflammation. Cold treatment should be applied for 10 to 15 minutes every 2 to 3 hours for inflammation and pain and immediately after any activity that aggravates your symptoms. Use ice packs or massage the area with a piece of ice (ice massage).  Heat treatment may be used prior to performing the stretching and strengthening activities prescribed by your caregiver, physical therapist, or athletic trainer. Use a heat pack or soak the injury in warm water.  SEEK IMMEDIATE MEDICAL  CARE IF:  Treatment seems to offer no benefit, or the condition worsens.  Any medications produce adverse side effects.  EXERCISES- RANGE OF MOTION (ROM) AND STRETCHING EXERCISES - Plantar Fasciitis (Heel Spur Syndrome) These exercises may help you when beginning to rehabilitate your injury. Your symptoms may resolve with or without further involvement from your physician, physical therapist or athletic trainer. While completing these exercises, remember:   Restoring tissue flexibility helps normal motion to return to the joints. This allows healthier, less painful movement and activity.  An effective stretch should be held for at least 30 seconds.  A stretch should never be painful. You should only feel a gentle lengthening   or release in the stretched tissue.  RANGE OF MOTION - Toe Extension, Flexion  Sit with your right / left leg crossed over your opposite knee.  Grasp your toes and gently pull them back toward the top of your foot. You should feel a stretch on the bottom of your toes and/or foot.  Hold this stretch for 10 seconds.  Now, gently pull your toes toward the bottom of your foot. You should feel a stretch on the top of your toes and or foot.  Hold this stretch for 10 seconds. Repeat  times. Complete this stretch 3 times per day.   RANGE OF MOTION - Ankle Dorsiflexion, Active Assisted  Remove shoes and sit on a chair that is preferably not on a carpeted surface.  Place right / left foot under knee. Extend your opposite leg for support.  Keeping your heel down, slide your right / left foot back toward the chair until you feel a stretch at your ankle or calf. If you do not feel a stretch, slide your bottom forward to the edge of the chair, while still keeping your heel down.  Hold this stretch for 10 seconds. Repeat 3 times. Complete this stretch 2 times per day.   STRETCH  Gastroc, Standing  Place hands on wall.  Extend right / left leg, keeping the front knee  somewhat bent.  Slightly point your toes inward on your back foot.  Keeping your right / left heel on the floor and your knee straight, shift your weight toward the wall, not allowing your back to arch.  You should feel a gentle stretch in the right / left calf. Hold this position for 10 seconds. Repeat 3 times. Complete this stretch 2 times per day.  STRETCH  Soleus, Standing  Place hands on wall.  Extend right / left leg, keeping the other knee somewhat bent.  Slightly point your toes inward on your back foot.  Keep your right / left heel on the floor, bend your back knee, and slightly shift your weight over the back leg so that you feel a gentle stretch deep in your back calf.  Hold this position for 10 seconds. Repeat 3 times. Complete this stretch 2 times per day.  STRETCH  Gastrocsoleus, Standing  Note: This exercise can place a lot of stress on your foot and ankle. Please complete this exercise only if specifically instructed by your caregiver.   Place the ball of your right / left foot on a step, keeping your other foot firmly on the same step.  Hold on to the wall or a rail for balance.  Slowly lift your other foot, allowing your body weight to press your heel down over the edge of the step.  You should feel a stretch in your right / left calf.  Hold this position for 10 seconds.  Repeat this exercise with a slight bend in your right / left knee. Repeat 3 times. Complete this stretch 2 times per day.   STRENGTHENING EXERCISES - Plantar Fasciitis (Heel Spur Syndrome)  These exercises may help you when beginning to rehabilitate your injury. They may resolve your symptoms with or without further involvement from your physician, physical therapist or athletic trainer. While completing these exercises, remember:   Muscles can gain both the endurance and the strength needed for everyday activities through controlled exercises.  Complete these exercises as instructed by  your physician, physical therapist or athletic trainer. Progress the resistance and repetitions only as guided.  STRENGTH -   Towel Curls  Sit in a chair positioned on a non-carpeted surface.  Place your foot on a towel, keeping your heel on the floor.  Pull the towel toward your heel by only curling your toes. Keep your heel on the floor. Repeat 3 times. Complete this exercise 2 times per day.  STRENGTH - Ankle Inversion  Secure one end of a rubber exercise band/tubing to a fixed object (table, pole). Loop the other end around your foot just before your toes.  Place your fists between your knees. This will focus your strengthening at your ankle.  Slowly, pull your big toe up and in, making sure the band/tubing is positioned to resist the entire motion.  Hold this position for 10 seconds.  Have your muscles resist the band/tubing as it slowly pulls your foot back to the starting position. Repeat 3 times. Complete this exercises 2 times per day.  Document Released: 03/17/2005 Document Revised: 06/09/2011 Document Reviewed: 06/29/2008 [employer] Patient Information 2014 [employer].  

## 2015-06-05 NOTE — Progress Notes (Signed)
Subjective:     Patient ID: Becky SmilesBrianne K Estrada, female   DOB: 1980-10-26, 35 y.o.   MRN: [id number]  HPI patient states I'm having a lot of pain in the outside of my left foot and also my right arch. This is been going on for several months and the pain in the left is been present for close to a year and it makes it hard for me to be active and I used to love to right   Review of Systems  All other systems reviewed and are negative.      Objective:   Physical Exam  Constitutional: She is oriented to person, place, and time.  Cardiovascular: Intact distal pulses.   Musculoskeletal: Normal range of motion.  Neurological: She is oriented to person, place, and time.  Skin: Skin is warm.  Nursing note and vitals reviewed.  neurovascular status found to be intact muscle strength was adequate with range of motion and it's. Patient is noted to have on the outside of the left foot exquisite discomfort at the peroneal insertion base of fifth metatarsal with fluid buildup and no muscle strength loss. Right arch is moderately discomforting within the mid arch area and there is also pain in the heel region bilateral with patient having had significant weight gain over the last short period of time     Assessment:     Acute peroneal tendinitis base of fifth metatarsal left with fasciitis-like symptoms bilateral    Plan:     H&P and x-rays reviewed. Today I carefully injected the sheath left 3 mg Kenalog 5 mill grams Xylocaine after explaining chances of risk associated with injection. I then applied Ashley brace bilateral to support the plantar arch and take pressure off peroneal tendon and advised on ice therapy and supportive shoe gear. Placed on sterile prep DS Dosepak 12 day and reappoint 2 weeks or earlier if any issues should occur  X-ray report indicates that there is no indications of stress fracture arthritis at the current time

## 2015-06-20 ENCOUNTER — Ambulatory Visit (INDEPENDENT_AMBULATORY_CARE_PROVIDER_SITE_OTHER): Payer: Managed Care, Other (non HMO) | Admitting: Podiatry

## 2015-06-20 ENCOUNTER — Encounter: Payer: Self-pay | Admitting: Podiatry

## 2015-06-20 VITALS — BP 144/90 | HR 65 | Resp 16

## 2015-06-20 DIAGNOSIS — M779 Enthesopathy, unspecified: Secondary | ICD-10-CM

## 2015-06-20 MED ORDER — MELOXICAM 15 MG PO TABS: 15.0000 mg | ORAL_TABLET | Freq: Every day | ORAL | Status: DC

## 2015-06-20 NOTE — Progress Notes (Signed)
Subjective:     Patient ID: Becky Estrada, female   DOB: Nov 24, 1980, 35 y.o.   MRN: [id number]  HPI patient states I'm feeling quite a bit better with mild to moderate discomfort still if I do a lot of walking but improvement which is quite significant   Review of Systems     Objective:   Physical Exam Neurovascular status intact muscle strength adequate with diminishment of discomfort left lateral foot with pain still present if she does a lot of walking. She's been using ice and bracing    Assessment:     Improve peroneal tendinitis left with pain still noted    Plan:     Advised patient on physical therapy supportive shoe gear usage and continued brace usage. Patient will be seen back to recheck

## 2015-07-09 ENCOUNTER — Other Ambulatory Visit: Payer: Self-pay | Admitting: Family Medicine

## 2015-07-09 MED ORDER — LORAZEPAM 1 MG PO TABS: 1.0000 mg | ORAL_TABLET | Freq: Four times a day (QID) | ORAL | Status: DC | PRN

## 2015-07-09 NOTE — Telephone Encounter (Signed)
Okay to refill? 

## 2015-07-09 NOTE — Telephone Encounter (Signed)
Pt request refill  LORazepam (ATIVAN) 1 MG tablet  [facility]  Pt states this was originally prescribed for anxiety on flights, but pt states this helps her sleep.  Pt seen 05/2015, if she needs appt, please advise. If not pt would like to know when she needs to follow up again?

## 2015-07-09 NOTE — Telephone Encounter (Signed)
Rx is been call in

## 2015-07-09 NOTE — Telephone Encounter (Signed)
Ok to refill for one month  

## 2015-07-30 ENCOUNTER — Telehealth: Payer: Self-pay | Admitting: Family Medicine

## 2015-07-30 ENCOUNTER — Ambulatory Visit: Payer: Managed Care, Other (non HMO) | Admitting: Family Medicine

## 2015-07-30 DIAGNOSIS — Z0289 Encounter for other administrative examinations: Secondary | ICD-10-CM

## 2015-07-30 NOTE — Telephone Encounter (Signed)
Pt was on schedule to see Dr. Fry, I called and left a voice message for pt to reschedule. 

## 2015-08-13 ENCOUNTER — Encounter: Payer: Self-pay | Admitting: Family Medicine

## 2015-08-13 ENCOUNTER — Ambulatory Visit (INDEPENDENT_AMBULATORY_CARE_PROVIDER_SITE_OTHER): Payer: Managed Care, Other (non HMO) | Admitting: Family Medicine

## 2015-08-13 VITALS — BP 150/107 | HR 70 | Temp 97.9°F

## 2015-08-13 DIAGNOSIS — I10 Essential (primary) hypertension: Secondary | ICD-10-CM

## 2015-08-13 DIAGNOSIS — R899 Unspecified abnormal finding in specimens from other organs, systems and tissues: Secondary | ICD-10-CM

## 2015-08-13 DIAGNOSIS — F411 Generalized anxiety disorder: Secondary | ICD-10-CM | POA: Diagnosis not present

## 2015-08-13 LAB — BASIC METABOLIC PANEL
BUN: 6 mg/dL (ref 6–23)
CALCIUM: 9.1 mg/dL (ref 8.4–10.5)
CO2: 30 mEq/L (ref 19–32)
Chloride: 100 mEq/L (ref 96–112)
Creatinine, Ser: 0.57 mg/dL (ref 0.40–1.20)
GFR: 126.63 mL/min (ref >60.00)
Glucose, Bld: 94 mg/dL (ref 70–99)
Potassium: 3.1 mEq/L — ABNORMAL LOW (ref 3.5–5.1)
Sodium: 138 mEq/L (ref 135–145)

## 2015-08-13 LAB — POC URINALSYSI DIPSTICK (AUTOMATED)
Glucose, UA: NEGATIVE
KETONES UA: NEGATIVE
Nitrite, UA: NEGATIVE
PH UA: 6
RBC UA: NEGATIVE
Spec Grav, UA: >=1.03
Urobilinogen, UA: 0.2

## 2015-08-13 LAB — CBC WITH DIFFERENTIAL/PLATELET
BASOS ABS: 0 10*3/uL (ref 0.0–0.1)
Basophils Relative: 0.6 % (ref 0.0–3.0)
EOS ABS: 0.3 10*3/uL (ref 0.0–0.7)
Eosinophils Relative: 6 % — ABNORMAL HIGH (ref 0.0–5.0)
HCT: 41.8 % (ref 36.0–46.0)
Hemoglobin: 14.5 g/dL (ref 12.0–15.0)
LYMPHS ABS: 1.6 10*3/uL (ref 0.7–4.0)
Lymphocytes Relative: 33.9 % (ref 12.0–46.0)
MCHC: 34.6 g/dL (ref 30.0–36.0)
MCV: 103.7 fl — ABNORMAL HIGH (ref 78.0–100.0)
MONO ABS: 0.5 10*3/uL (ref 0.1–1.0)
Monocytes Relative: 9.8 % (ref 3.0–12.0)
NEUTROS ABS: 2.3 10*3/uL (ref 1.4–7.7)
NEUTROS PCT: 49.7 % (ref 43.0–77.0)
PLATELETS: 264 10*3/uL (ref 150.0–400.0)
RBC: 4.03 Mil/uL (ref 3.87–5.11)
RDW: 14.1 % (ref 11.5–15.5)
WBC: 4.6 10*3/uL (ref 4.0–10.5)

## 2015-08-13 LAB — HEPATIC FUNCTION PANEL
ALBUMIN: 4 g/dL (ref 3.5–5.2)
ALT: 84 U/L — AB (ref 0–35)
AST: 56 U/L — AB (ref 0–37)
Alkaline Phosphatase: 61 U/L (ref 39–117)
Bilirubin, Direct: 0.2 mg/dL (ref 0.0–0.3)
Total Bilirubin: 1.1 mg/dL (ref 0.2–1.2)
Total Protein: 7.2 g/dL (ref 6.0–8.3)

## 2015-08-13 LAB — LIPID PANEL
CHOL/HDL RATIO: 5
Cholesterol: 176 mg/dL (ref 0–200)
HDL: 36.6 mg/dL — AB (ref >39.00)
LDL CALC: 106 mg/dL — AB (ref 0–99)
NonHDL: 139.76
TRIGLYCERIDES: 170 mg/dL — AB (ref 0.0–149.0)
VLDL: 34 mg/dL (ref 0.0–40.0)

## 2015-08-13 LAB — TSH: TSH: 4.61 u[IU]/mL — AB (ref 0.35–4.50)

## 2015-08-13 MED ORDER — SCOPOLAMINE 1 MG/3DAYS TD PT72: 1.0000 | MEDICATED_PATCH | TRANSDERMAL | Status: DC

## 2015-08-13 MED ORDER — LORAZEPAM 1 MG PO TABS: 1.0000 mg | ORAL_TABLET | Freq: Four times a day (QID) | ORAL | Status: DC | PRN

## 2015-08-13 MED ORDER — AMLODIPINE BESYLATE 5 MG PO TABS: 5.0000 mg | ORAL_TABLET | Freq: Every day | ORAL | Status: DC

## 2015-08-13 NOTE — Progress Notes (Signed)
   Subjective:    Patient ID: Becky SmilesBrianne K Peavy, female    DOB: Jun 06, 1980, 35 y.o.   MRN: [id number]  HPI Here to follow up. Her anxiety has been stable. She does not check her BP. She declined being weighed today but I can tell she has put on more weight, and she agreed. She has decided to start exercising again.    Review of Systems  Constitutional: Negative.   Respiratory: Negative.   Cardiovascular: Negative.   Neurological: Negative.   Psychiatric/Behavioral: Negative.        Objective:   Physical Exam  Constitutional: She is oriented to person, place, and time. She appears well-developed and well-nourished.  Neck: No thyromegaly present.  Cardiovascular: Normal rate, regular rhythm, normal heart sounds and intact distal pulses.   Pulmonary/Chest: Effort normal and breath sounds normal.  Lymphadenopathy:    She has no cervical adenopathy.  Neurological: She is alert and oriented to person, place, and time.          Assessment & Plan:  Her anxiety is stable. Her HTN is not well controlled, and I urged her to lose weight. Add Amlodipine to 5 mg daily to her regimen. She is leaving on a cruise to [place] this week, so she will try scopolamine patches for sea sickness.  Nelwyn SalisburyFRY,STEPHEN A, MD

## 2015-08-13 NOTE — Progress Notes (Signed)
Pre visit review using our clinic review tool, if applicable. No additional management support is needed unless otherwise documented below in the visit note. Pt declined to weigh 

## 2015-08-14 MED ORDER — LEVOTHYROXINE SODIUM 50 MCG PO TABS: 50.0000 ug | ORAL_TABLET | Freq: Every day | ORAL | Status: DC

## 2015-08-14 NOTE — Addendum Note (Signed)
Addended by: Johnella MoloneyFUNDERBURK, Keyston Ardolino A on: 08/14/2015 02:20 PM   Modules accepted: Orders

## 2015-09-07 ENCOUNTER — Ambulatory Visit (INDEPENDENT_AMBULATORY_CARE_PROVIDER_SITE_OTHER): Payer: Managed Care, Other (non HMO) | Admitting: Podiatry

## 2015-09-07 ENCOUNTER — Encounter: Payer: Self-pay | Admitting: Podiatry

## 2015-09-07 DIAGNOSIS — M722 Plantar fascial fibromatosis: Secondary | ICD-10-CM

## 2015-09-07 MED ORDER — TRIAMCINOLONE ACETONIDE 10 MG/ML IJ SUSP
10.0000 mg | Freq: Once | INTRAMUSCULAR | Status: AC
  Administered 2015-09-07: 10 mg

## 2015-09-07 NOTE — Progress Notes (Signed)
Subjective:     Patient ID: Becky Estrada, female   DOB: 09-30-1980, 35 y.o.   MRN: [id number]  HPI patient presents stating I'm getting a lot of pain in my left heel still and it's very sore when pressed   Review of Systems     Objective:   Physical Exam Neurovascular status intact muscle strength adequate continued discomfort in the plantar fascial left with fluid buildup around the medial band    Assessment:     Plantar fasciitis left with inflammation fluid buildup    Plan:     H&P conditions reviewed and reinjected the plantar fascial left 3 mg Kenalog 5 of Xylocaine and applied strapping to try to her reduce stress. Reappoint in the next few weeks to reevaluate

## 2015-11-14 ENCOUNTER — Other Ambulatory Visit (INDEPENDENT_AMBULATORY_CARE_PROVIDER_SITE_OTHER): Payer: Managed Care, Other (non HMO)

## 2015-11-14 DIAGNOSIS — R899 Unspecified abnormal finding in specimens from other organs, systems and tissues: Secondary | ICD-10-CM

## 2015-11-14 LAB — TSH: TSH: 3.44 u[IU]/mL (ref 0.35–4.50)

## 2015-12-31 ENCOUNTER — Telehealth: Payer: Self-pay | Admitting: Family Medicine

## 2015-12-31 NOTE — Telephone Encounter (Signed)
Yes I can see him thanks  

## 2015-12-31 NOTE — Telephone Encounter (Signed)
Pt son just turned [age] and mom would like to est with dr fry. Can I sch?

## 2016-01-03 NOTE — Telephone Encounter (Signed)
Pt son has been sch °

## 2016-01-17 ENCOUNTER — Ambulatory Visit (INDEPENDENT_AMBULATORY_CARE_PROVIDER_SITE_OTHER): Payer: Managed Care, Other (non HMO) | Admitting: Family Medicine

## 2016-01-17 ENCOUNTER — Encounter: Payer: Self-pay | Admitting: Family Medicine

## 2016-01-17 VITALS — BP 137/95 | HR 72 | Temp 98.0°F | Ht 63.0 in | Wt 222.0 lb

## 2016-01-17 DIAGNOSIS — R1032 Left lower quadrant pain: Secondary | ICD-10-CM

## 2016-01-17 DIAGNOSIS — N200 Calculus of kidney: Secondary | ICD-10-CM

## 2016-01-17 LAB — POC URINALSYSI DIPSTICK (AUTOMATED)
Bilirubin, UA: NEGATIVE
Blood, UA: NEGATIVE
Glucose, UA: NEGATIVE
Ketones, UA: NEGATIVE
LEUKOCYTES UA: NEGATIVE
NITRITE UA: NEGATIVE
PH UA: 6
PROTEIN UA: NEGATIVE
Spec Grav, UA: 1.02
Urobilinogen, UA: 0.2

## 2016-01-17 MED ORDER — CIPROFLOXACIN HCL 500 MG PO TABS: 500.0000 mg | ORAL_TABLET | Freq: Two times a day (BID) | ORAL | 0 refills | Status: DC

## 2016-01-17 MED ORDER — METRONIDAZOLE 500 MG PO TABS: 500.0000 mg | ORAL_TABLET | Freq: Three times a day (TID) | ORAL | 0 refills | Status: DC

## 2016-01-17 NOTE — Progress Notes (Signed)
Pre visit review using our clinic review tool, if applicable. No additional management support is needed unless otherwise documented below in the visit note. 

## 2016-01-17 NOTE — Progress Notes (Signed)
   Subjective:    Patient ID: Becky Estrada, female    DOB: [birth year], 35 y.o.   MRN: [id number]  HPI Here for one week of intermittent mild to moderate pain in the left flank and LLQ of the abdomen. No fever. No nausea or vomiting. Appetite is good. No urinary symptoms. Her BMs are of normal consistency but they are much smaller than usual. She feels the urge to pass a stool but often cannot. When she does pass a stool, this relieves the pain for awhile.    Review of Systems  Constitutional: Negative.   Respiratory: Negative.   Cardiovascular: Negative.   Gastrointestinal: Positive for abdominal pain. Negative for abdominal distention, anal bleeding, blood in stool, constipation, diarrhea, nausea, rectal pain and vomiting.  Genitourinary: Negative.   Neurological: Negative.        Objective:   Physical Exam  Constitutional: She appears well-developed and well-nourished. No distress.  Cardiovascular: Normal rate, regular rhythm, normal heart sounds and intact distal pulses.   Pulmonary/Chest: Effort normal and breath sounds normal.  Abdominal: Soft. Bowel sounds are normal. She exhibits no distension and no mass. There is no rebound and no guarding.  Tender in the left flank and LLQ          Assessment & Plan:  Probable diverticulitis. Treat with Cipro and Flagyl. She will return if the pain gets worse, if she starts vomiting, if she develops fever, etc.  Nelwyn SalisburyFRY,Conal Shetley A, MD

## 2016-01-30 ENCOUNTER — Telehealth: Payer: Self-pay | Admitting: Family Medicine

## 2016-01-30 NOTE — Telephone Encounter (Signed)
Patient Name: Becky HammockBRIANNE Karen DOB: 1980/09/21 Initial Comment Caller got stung by a wasp a week ago the stop is really red, hot and itchy - it looks worse Nurse Assessment Nurse: Charna Elizabethrumbull, RN, Lynden Angathy Date/Time (Eastern Time): 01/30/2016 2:33:12 PM Confirm and document reason for call. If symptomatic, describe symptoms. You must click the next button to save text entered. ---Caller states she had a wasp sting to her right ankle a week ago that still looks red and inflamed (pain rated as a 3 on the 1 to 10 scale). No fever. No severe breathing or swallowing difficulty. Alert and responsive. Has the patient traveled out of the country within the last 30 days? ---No Does the patient have any new or worsening symptoms? ---Yes Will a triage be completed? ---Yes Related visit to physician within the last 2 weeks? ---No Does the PT have any chronic conditions? (i.e. diabetes, asthma, etc.) ---Yes List chronic conditions. ---High Blood Pressure, Thyroid problems Is the patient pregnant or possibly pregnant? (Ask all females between the ages of 5712-55) ---No Is this a behavioral health or substance abuse call? ---No Guidelines Guideline Title Affirmed Question Affirmed Notes Bee or Yellow Jacket Sting [1] Red or very tender (to touch) area AND [2] started over 24 hours after the sting Final Disposition User See Physician within 24 Hours Pittsburgrumbull, RN, Lynden Angathy Comments Scheduled appointment for 9:30am with Dr. Clent RidgesFry on 01/31/16. Referrals REFERRED TO PCP OFFICE Disagree/Comply: Comply

## 2016-01-31 ENCOUNTER — Encounter: Payer: Self-pay | Admitting: Family Medicine

## 2016-01-31 ENCOUNTER — Ambulatory Visit (INDEPENDENT_AMBULATORY_CARE_PROVIDER_SITE_OTHER): Payer: Managed Care, Other (non HMO) | Admitting: Family Medicine

## 2016-01-31 VITALS — BP 124/86 | HR 76 | Temp 97.9°F | Ht 63.0 in | Wt 221.0 lb

## 2016-01-31 DIAGNOSIS — R1032 Left lower quadrant pain: Secondary | ICD-10-CM

## 2016-01-31 DIAGNOSIS — T63444A Toxic effect of venom of bees, undetermined, initial encounter: Secondary | ICD-10-CM

## 2016-01-31 NOTE — Progress Notes (Signed)
   Subjective:    Patient ID: Becky Estrada, female    DOB: May 18, 1980, 35 y.o.   MRN: [id number]  HPI Here to check on the site of a bee sting which occurred at home on 01-25-16. While standing on her front porch she was stung on the right lower leg. The area immediately swelled up and was very painful. She denies any lip swelling, SOB, etc. She has done well since then although the area is itchy and tender. Of note we saw her for LLQ abdominal pain which was presumed to be diverticulitis on 01-17-16. She was prescribed Cipro and Flagyl. She stopped taking Cipro after only one day because she developed burning in the hands and feet. This burning immediately resolved after that. She did complete a 5 day course of Flagyl. The pain went away and her BMs are now back to normal.    Review of Systems  Constitutional: Negative.   Respiratory: Negative.   Cardiovascular: Negative.   Gastrointestinal: Negative.   Skin: Positive for wound.       Objective:   Physical Exam  Constitutional: She is oriented to person, place, and time. She appears well-developed and well-nourished.  Cardiovascular: Normal rate, regular rhythm, normal heart sounds and intact distal pulses.   Pulmonary/Chest: Effort normal and breath sounds normal.  Abdominal: Soft. Bowel sounds are normal. She exhibits no distension and no mass. There is no tenderness. There is no rebound and no guarding.  Neurological: She is alert and oriented to person, place, and time.  Skin:  Area of warmth, erythema and tenderness at the dorsal junction of the right lower leg and foot           Assessment & Plan:  The bee sting site is slowly resolving. She can apply ice as needed. Her abdominal pain has resolved.  Nelwyn SalisburyFRY,Sherylann Vangorden A, MD

## 2016-01-31 NOTE — Progress Notes (Signed)
Pre visit review using our clinic review tool, if applicable. No additional management support is needed unless otherwise documented below in the visit note. 

## 2016-03-11 ENCOUNTER — Other Ambulatory Visit: Payer: Self-pay | Admitting: Family Medicine

## 2016-03-12 NOTE — Telephone Encounter (Signed)
Call in #[phone] with 5 rf 

## 2016-05-18 ENCOUNTER — Other Ambulatory Visit: Payer: Self-pay | Admitting: Family Medicine

## 2016-07-12 ENCOUNTER — Other Ambulatory Visit: Payer: Self-pay | Admitting: Family Medicine

## 2016-08-26 ENCOUNTER — Other Ambulatory Visit: Payer: Self-pay | Admitting: Family Medicine

## 2016-10-04 ENCOUNTER — Other Ambulatory Visit: Payer: Self-pay | Admitting: Family Medicine

## 2016-10-14 ENCOUNTER — Other Ambulatory Visit: Payer: Self-pay | Admitting: Family Medicine

## 2016-10-15 NOTE — Telephone Encounter (Signed)
Call in #[phone] with 5 rf 

## 2016-12-18 ENCOUNTER — Encounter: Payer: Self-pay | Admitting: Family Medicine

## 2016-12-26 ENCOUNTER — Ambulatory Visit: Payer: Self-pay | Admitting: Family Medicine

## 2016-12-26 DIAGNOSIS — Z0289 Encounter for other administrative examinations: Secondary | ICD-10-CM

## 2017-02-02 ENCOUNTER — Other Ambulatory Visit: Payer: Self-pay | Admitting: Family Medicine

## 2017-02-25 ENCOUNTER — Other Ambulatory Visit: Payer: Self-pay | Admitting: Family Medicine

## 2017-02-25 NOTE — Telephone Encounter (Signed)
Sent to PCP for approval to refill last seen a year ago 01/2016.

## 2017-03-11 ENCOUNTER — Ambulatory Visit (INDEPENDENT_AMBULATORY_CARE_PROVIDER_SITE_OTHER): Payer: Managed Care, Other (non HMO)

## 2017-03-11 ENCOUNTER — Encounter: Payer: Self-pay | Admitting: Podiatry

## 2017-03-11 ENCOUNTER — Other Ambulatory Visit: Payer: Self-pay | Admitting: Podiatry

## 2017-03-11 ENCOUNTER — Ambulatory Visit: Payer: Managed Care, Other (non HMO) | Admitting: Podiatry

## 2017-03-11 DIAGNOSIS — M779 Enthesopathy, unspecified: Secondary | ICD-10-CM

## 2017-03-11 DIAGNOSIS — S99922A Unspecified injury of left foot, initial encounter: Secondary | ICD-10-CM | POA: Diagnosis not present

## 2017-03-11 MED ORDER — DICLOFENAC SODIUM 75 MG PO TBEC: 75.0000 mg | DELAYED_RELEASE_TABLET | Freq: Two times a day (BID) | ORAL | 2 refills | Status: DC

## 2017-03-11 NOTE — Progress Notes (Signed)
Subjective:   Patient ID: Becky Estrada, female   DOB: 36 y.o.   MRN: [id number]   HPI Patient presents stating that she traumatized her left foot and it has been sore for the last 2-1/2 weeks.  States it is hard for her to be active on her foot.    ROS      Objective:  Physical Exam  Neurovascular status intact with patient's left dorsal foot being tender in the midfoot area with inflammation also noted around the metatarsal phalangeal joints.  Patient does not have any indication of tendon tear.     Assessment:  Inflammatory changes consistent with trauma to the left foot with no indication of tendon dysfunction and possibility for bony injury.     Plan:  H&P x-ray reviewed and advised on aggressive ice therapy oral anti-inflammatories and possibility steroidal injection if symptoms were to get worse.  Patient will be seen back if symptoms are not still significant over the next 2-4 weeks.  And patient will be seen back if other issues should occur.  X-rays indicated no signs of fracture appears to be soft tissue injury

## 2017-05-25 ENCOUNTER — Ambulatory Visit (INDEPENDENT_AMBULATORY_CARE_PROVIDER_SITE_OTHER): Payer: Managed Care, Other (non HMO) | Admitting: Family Medicine

## 2017-05-25 ENCOUNTER — Encounter: Payer: Self-pay | Admitting: Family Medicine

## 2017-05-25 VITALS — BP 150/80 | HR 84 | Temp 97.5°F | Ht 63.0 in | Wt 223.0 lb

## 2017-05-25 DIAGNOSIS — Z0000 Encounter for general adult medical examination without abnormal findings: Secondary | ICD-10-CM

## 2017-05-25 DIAGNOSIS — E039 Hypothyroidism, unspecified: Secondary | ICD-10-CM

## 2017-05-25 LAB — POC URINALSYSI DIPSTICK (AUTOMATED)
Bilirubin, UA: NEGATIVE
GLUCOSE UA: NEGATIVE
Ketones, UA: NEGATIVE
LEUKOCYTES UA: NEGATIVE
Nitrite, UA: NEGATIVE
PH UA: 6 (ref 5.0–8.0)
Protein, UA: NEGATIVE
RBC UA: NEGATIVE
Spec Grav, UA: 1.02 (ref 1.010–1.025)
UROBILINOGEN UA: 0.2 U/dL

## 2017-05-25 MED ORDER — LORAZEPAM 1 MG PO TABS: 1.0000 mg | ORAL_TABLET | Freq: Four times a day (QID) | ORAL | 5 refills | Status: DC | PRN

## 2017-05-25 NOTE — Progress Notes (Signed)
   Subjective:    Patient ID: Cassell SmilesBrianne K Gaw, female    DOB: [birth year], 37 y.o.   MRN: [id number]  HPI Here for a well exam. She feels fine.    Review of Systems  Constitutional: Negative.   HENT: Negative.   Eyes: Negative.   Respiratory: Negative.   Cardiovascular: Negative.   Gastrointestinal: Negative.   Genitourinary: Negative for decreased urine volume, difficulty urinating, dyspareunia, dysuria, enuresis, flank pain, frequency, hematuria, pelvic pain and urgency.  Musculoskeletal: Negative.   Skin: Negative.   Neurological: Negative.   Psychiatric/Behavioral: Negative.        Objective:   Physical Exam  Constitutional: She is oriented to person, place, and time. She appears well-developed and well-nourished. No distress.  HENT:  Head: Normocephalic and atraumatic.  Right Ear: External ear normal.  Left Ear: External ear normal.  Nose: Nose normal.  Mouth/Throat: Oropharynx is clear and moist. No oropharyngeal exudate.  Eyes: Conjunctivae and EOM are normal. Pupils are equal, round, and reactive to light. No scleral icterus.  Neck: Normal range of motion. Neck supple. No JVD present. No thyromegaly present.  Cardiovascular: Normal rate, regular rhythm, normal heart sounds and intact distal pulses. Exam reveals no gallop and no friction rub.  No murmur heard. Pulmonary/Chest: Effort normal and breath sounds normal. No respiratory distress. She has no wheezes. She has no rales. She exhibits no tenderness.  Abdominal: Soft. Bowel sounds are normal. She exhibits no distension and no mass. There is no tenderness. There is no rebound and no guarding.  Musculoskeletal: Normal range of motion. She exhibits no edema or tenderness.  Lymphadenopathy:    She has no cervical adenopathy.  Neurological: She is alert and oriented to person, place, and time. She has normal reflexes. No cranial nerve deficit. She exhibits normal muscle tone. Coordination normal.  Skin: Skin is warm  and dry. No rash noted. No erythema.  Psychiatric: She has a normal mood and affect. Her behavior is normal. Judgment and thought content normal.          Assessment & Plan:  Well exam. We discussed diet and exercise. Get fasting labs.  Gershon CraneStephen Eastin Swing, MD

## 2017-05-26 LAB — T4, FREE: FREE T4: 0.79 ng/dL (ref 0.60–1.60)

## 2017-05-26 LAB — CBC WITH DIFFERENTIAL/PLATELET
Basophils Absolute: 0 10*3/uL (ref 0.0–0.1)
Basophils Relative: 0.5 % (ref 0.0–3.0)
EOS PCT: 4.6 % (ref 0.0–5.0)
Eosinophils Absolute: 0.3 10*3/uL (ref 0.0–0.7)
HEMATOCRIT: 44.8 % (ref 36.0–46.0)
HEMOGLOBIN: 15.8 g/dL — AB (ref 12.0–15.0)
LYMPHS PCT: 38 % (ref 12.0–46.0)
Lymphs Abs: 2.5 10*3/uL (ref 0.7–4.0)
MCHC: 35.3 g/dL (ref 30.0–36.0)
MCV: 100.7 fl — AB (ref 78.0–100.0)
Monocytes Absolute: 0.5 10*3/uL (ref 0.1–1.0)
Monocytes Relative: 7.5 % (ref 3.0–12.0)
Neutro Abs: 3.3 10*3/uL (ref 1.4–7.7)
Neutrophils Relative %: 49.4 % (ref 43.0–77.0)
Platelets: 252 10*3/uL (ref 150.0–400.0)
RBC: 4.45 Mil/uL (ref 3.87–5.11)
RDW: 13.2 % (ref 11.5–15.5)
WBC: 6.6 10*3/uL (ref 4.0–10.5)

## 2017-05-26 LAB — HEPATIC FUNCTION PANEL
ALT: 38 U/L — ABNORMAL HIGH (ref 0–35)
AST: 34 U/L (ref 0–37)
Albumin: 4 g/dL (ref 3.5–5.2)
Alkaline Phosphatase: 73 U/L (ref 39–117)
BILIRUBIN TOTAL: 0.8 mg/dL (ref 0.2–1.2)
Bilirubin, Direct: 0.2 mg/dL (ref 0.0–0.3)
Total Protein: 7.5 g/dL (ref 6.0–8.3)

## 2017-05-26 LAB — LIPID PANEL
CHOL/HDL RATIO: 4
Cholesterol: 202 mg/dL — ABNORMAL HIGH (ref 0–200)
HDL: 47.7 mg/dL (ref >39.00)
LDL Cholesterol: 118 mg/dL — ABNORMAL HIGH (ref 0–99)
NONHDL: 154.13
TRIGLYCERIDES: 183 mg/dL — AB (ref 0.0–149.0)
VLDL: 36.6 mg/dL (ref 0.0–40.0)

## 2017-05-26 LAB — BASIC METABOLIC PANEL
BUN: 7 mg/dL (ref 6–23)
CO2: 31 mEq/L (ref 19–32)
CREATININE: 0.62 mg/dL (ref 0.40–1.20)
Calcium: 9.5 mg/dL (ref 8.4–10.5)
Chloride: 99 mEq/L (ref 96–112)
GFR: 113.84 mL/min (ref >60.00)
Glucose, Bld: 84 mg/dL (ref 70–99)
Potassium: 3.7 mEq/L (ref 3.5–5.1)
Sodium: 137 mEq/L (ref 135–145)

## 2017-05-26 LAB — TSH: TSH: 2.81 u[IU]/mL (ref 0.35–4.50)

## 2017-05-26 LAB — T3, FREE: T3, Free: 3.6 pg/mL (ref 2.3–4.2)

## 2017-12-07 ENCOUNTER — Other Ambulatory Visit: Payer: Self-pay | Admitting: Family Medicine

## 2017-12-07 NOTE — Telephone Encounter (Signed)
Last OV 05/25/2017   Last refilled 05/25/2017 disp 60 with 5 refills   Sent to PCP for approval

## 2017-12-08 NOTE — Telephone Encounter (Signed)
Call in #[phone] with 5 rf 

## 2018-01-29 ENCOUNTER — Encounter: Payer: Self-pay | Admitting: Family Medicine

## 2018-01-29 ENCOUNTER — Ambulatory Visit: Payer: Managed Care, Other (non HMO) | Admitting: Family Medicine

## 2018-01-29 VITALS — BP 120/90 | HR 75 | Temp 97.8°F

## 2018-01-29 DIAGNOSIS — H6012 Cellulitis of left external ear: Secondary | ICD-10-CM

## 2018-01-29 DIAGNOSIS — J029 Acute pharyngitis, unspecified: Secondary | ICD-10-CM

## 2018-01-29 MED ORDER — TRIAMCINOLONE ACETONIDE 0.1 % EX CREA: 1.0000 "application " | TOPICAL_CREAM | Freq: Two times a day (BID) | CUTANEOUS | 0 refills | Status: DC

## 2018-01-29 MED ORDER — DOXYCYCLINE HYCLATE 100 MG PO TABS: 100.0000 mg | ORAL_TABLET | Freq: Two times a day (BID) | ORAL | 0 refills | Status: AC

## 2018-01-29 MED ORDER — MUPIROCIN 2 % EX OINT: 1.0000 "application " | TOPICAL_OINTMENT | Freq: Two times a day (BID) | CUTANEOUS | 0 refills | Status: DC

## 2018-01-29 NOTE — Progress Notes (Signed)
LAURALYE KINN DOB: Oct 10, 1980 Encounter date: 01/29/2018  This is a 37 y.o. female who presents with Chief Complaint  Patient presents with  . Sore Throat    History of present illness:  Gets tonsillitis every few years. Ibuprofen helps. Does feel a little better from yesterday. No fevers. Maybe low grade last night. Just felt bad yesterday.   Glands feel swollen.   No ear pain, sinus pain, sinus congestion. Ears are popping.     Allergies  Allergen Reactions  . Ciprofloxacin     Feet burning   Current Meds  Medication Sig  . hydrochlorothiazide (HYDRODIURIL) 25 MG tablet TAKE 1 TABLET BY MOUTH DAILY  . levonorgestrel (MIRENA) 20 MCG/24HR IUD 1 each by Intrauterine route once.    Marland Kitchen levothyroxine (SYNTHROID, LEVOTHROID) 50 MCG tablet TAKE 1 TABLET BY MOUTH ONCE A DAY BEFORE BREAKFAST (NEW BRAND OF GENERIC)  . LORazepam (ATIVAN) 1 MG tablet TAKE ONE TABLET BY MOUTH EVERY 6 HOURS AS NEEDED FOR ANXIETY  . metoprolol succinate (TOPROL-XL) 100 MG 24 hr tablet TAKE 1 TABLET BY MOUTH DAILY. TAKE WITH OR IMMEDIATELY FOLLOWING A MEAL. *SCHEDULE PHYSICAL*    Review of Systems  Constitutional: Negative for chills and fever.  HENT: Positive for congestion and sore throat. Negative for ear pain, postnasal drip, sinus pressure, sinus pain and trouble swallowing.   Respiratory: Negative for cough, shortness of breath and wheezing.   Cardiovascular: Negative for chest pain.  Skin:       Not sure what happened to left ear. Thinks may have had bit of some osrt. Feels sore, dry. Putting neosporin on it now for a couple of weeks.     Objective:  BP 120/90 (BP Location: Left Arm, Patient Position: Sitting, Cuff Size: Large)   Pulse 75   Temp 97.8 F (36.6 C) (Oral)   SpO2 97%       BP Readings from Last 3 Encounters:  01/29/18 120/90  05/25/17 (!) 150/80  01/31/16 124/86   Wt Readings from Last 3 Encounters:  05/25/17 223 lb (101.2 kg)  01/31/16 221 lb (100.2 kg)  01/17/16  222 lb (100.7 kg)    Physical Exam  Constitutional: She is oriented to person, place, and time. She appears well-developed and well-nourished. No distress.  HENT:  Right Ear: Tympanic membrane, external ear and ear canal normal.  Left Ear: Tympanic membrane and ear canal normal.  Mouth/Throat: Uvula is midline, oropharynx is clear and moist and mucous membranes are normal. No oropharyngeal exudate, posterior oropharyngeal edema or tonsillar abscesses. Posterior oropharyngeal erythema: minimal. Tonsils are 2+ on the right. Tonsils are 2+ on the left.  Left ear erythematous, scaling tender, warm apical lobe  Cardiovascular: Normal rate, regular rhythm and normal heart sounds. Exam reveals no friction rub.  No murmur heard. No lower extremity edema  Pulmonary/Chest: Effort normal and breath sounds normal. No respiratory distress. She has no wheezes. She has no rales.  Lymphadenopathy:       Head (right side): Submandibular adenopathy present. No submental adenopathy present.       Head (left side): Submandibular adenopathy present.    She has no cervical adenopathy.  Neurological: She is alert and oriented to person, place, and time.  Psychiatric: Her behavior is normal. Cognition and memory are normal.    Assessment/Plan  1. Cellulitis of left ear Stop neosporin. Topical bactroban and in a couple of days add triamcinolone. Keep skin moisturized. If not resolved in 2 weeks return for re-examine.  2. Pharyngitis.  abx printed in case of worsening of throat sx (hx recurrent tonsillitis), but currently tonsils are stable and symptoms are slightly improved so do not think abx warrented.    Return if symptoms worsen or fail to improve.    Theodis Shove, MD

## 2018-03-06 ENCOUNTER — Other Ambulatory Visit: Payer: Self-pay | Admitting: Family Medicine

## 2018-03-08 NOTE — Telephone Encounter (Signed)
Requested medication (s) are due for refill today: Yes  Requested medication (s) are on the active medication list: Yes  Last refill:  02/26/17  Future visit scheduled: No  Notes to clinic:  Expired medication, Rx not received from pharmacy that was sent on 02/26/18.     Requested Prescriptions  Pending Prescriptions Disp Refills   hydrochlorothiazide (HYDRODIURIL) 25 MG tablet [Pharmacy Med Name: hydroCHLOROthiazide 25 MG TABLET] 90 tablet 2    Sig: TAKE 1 TABLET BY MOUTH DAILY     Cardiovascular: Diuretics - Thiazide Failed - 03/08/2018  2:51 PM      Failed - Last BP in normal range    BP Readings from Last 1 Encounters:  01/29/18 120/90         Passed - Ca in normal range and within 360 days    Calcium  Date Value Ref Range Status  05/25/2017 9.5 8.4 - 10.5 mg/dL Final         Passed - Cr in normal range and within 360 days    Creatinine, Ser  Date Value Ref Range Status  05/25/2017 0.62 0.40 - 1.20 mg/dL Final         Passed - K in normal range and within 360 days    Potassium  Date Value Ref Range Status  05/25/2017 3.7 3.5 - 5.1 mEq/L Final         Passed - Na in normal range and within 360 days    Sodium  Date Value Ref Range Status  05/25/2017 137 135 - 145 mEq/L Final         Passed - Valid encounter within last 6 months    Recent Outpatient Visits          1 month ago Cellulitis of left ear   [facility], [place] LoreJunell C, MD   9 months ago [facility] health care   [facility], Tera MaterStephen A, MD   2 years ago Bee sting, undetermined intent, initial Chief of Staffencounter   [facility], Tera MaterStephen A, MD   2 years ago LLQ abdominal pain   [facility], Tera MaterStephen A, MD   2 years ago Essential hypertension   [facility], Tera MaterStephen A, MD

## 2018-03-08 NOTE — Telephone Encounter (Signed)
Copied from CRM [id number]. Topic: Quick Communication - Rx Refill/Question >> Mar 08, 2018  2:48 PM Percival SpanishKennedy, Cheryl W wrote: Medication  hydrochlorothiazide (HYDRODIURIL) 25 MG tablet     RX sent in on 02/26/18 pt said they did not received please resend   Has the patient contacted their pharmacy   yes    [facility]   Agent: Please be advised that RX refills may take up to 3 business days. We ask that you follow-up with your pharmacy.

## 2018-03-18 ENCOUNTER — Other Ambulatory Visit: Payer: Self-pay | Admitting: Family Medicine

## 2018-04-21 ENCOUNTER — Other Ambulatory Visit: Payer: Self-pay | Admitting: Family Medicine

## 2018-06-11 ENCOUNTER — Telehealth: Payer: Self-pay | Admitting: Family Medicine

## 2018-06-11 MED ORDER — METOPROLOL SUCCINATE ER 100 MG PO TB24: ORAL_TABLET | ORAL | 0 refills | Status: DC

## 2018-06-11 NOTE — Telephone Encounter (Signed)
Copied from CRM [id number]. Topic: Quick Communication - Rx Refill/Question >> Jun 11, 2018  3:29 PM Maia Petties wrote: Medication: metoprolol succinate (TOPROL-XL) 100 MG 24 hr tablet  - 10 days left CPE scheduled for 07/16/2018 (first available morning appt)  Has the patient contacted their pharmacy?no - called to make appt Preferred Pharmacy (with phone number or street name): [facility] [address], [place] - [address] [phone] (Phone) [phone] (Fax)

## 2018-06-11 NOTE — Telephone Encounter (Signed)
Refill has been sent to the pharmacy and pt has appt in April for CPX

## 2018-07-16 ENCOUNTER — Encounter: Payer: Self-pay | Admitting: Family Medicine

## 2018-07-16 ENCOUNTER — Other Ambulatory Visit: Payer: Self-pay

## 2018-07-16 ENCOUNTER — Ambulatory Visit (INDEPENDENT_AMBULATORY_CARE_PROVIDER_SITE_OTHER): Payer: Managed Care, Other (non HMO) | Admitting: Family Medicine

## 2018-07-16 DIAGNOSIS — I10 Essential (primary) hypertension: Secondary | ICD-10-CM | POA: Diagnosis not present

## 2018-07-16 DIAGNOSIS — E039 Hypothyroidism, unspecified: Secondary | ICD-10-CM | POA: Diagnosis not present

## 2018-07-16 DIAGNOSIS — F411 Generalized anxiety disorder: Secondary | ICD-10-CM

## 2018-07-16 MED ORDER — LEVOTHYROXINE SODIUM 50 MCG PO TABS: ORAL_TABLET | ORAL | 3 refills | Status: DC

## 2018-07-16 MED ORDER — METOPROLOL SUCCINATE ER 100 MG PO TB24: ORAL_TABLET | ORAL | 3 refills | Status: DC

## 2018-07-16 MED ORDER — LORAZEPAM 1 MG PO TABS: 1.0000 mg | ORAL_TABLET | Freq: Four times a day (QID) | ORAL | 5 refills | Status: DC | PRN

## 2018-07-16 MED ORDER — HYDROCHLOROTHIAZIDE 25 MG PO TABS: 25.0000 mg | ORAL_TABLET | Freq: Every day | ORAL | 3 refills | Status: DC

## 2018-07-16 NOTE — Progress Notes (Signed)
Subjective:    Patient ID: Becky Estrada, female    DOB: 1981/02/18, 38 y.o.   MRN: [id number]  HPI Virtual Visit via Video Note  I connected with the patient on 07/16/18 at 10:00 AM EDT by a video enabled telemedicine application and verified that I am speaking with the correct person using two identifiers.  Location patient: home Location provider:work or home office Persons participating in the virtual visit: patient, provider  I discussed the limitations of evaluation and management by telemedicine and the availability of in person appointments. The patient expressed understanding and agreed to proceed.   HPI: Here to discuss refills. She has been doing well. She has had some anxiety at work, since she works at [employer] and is around [employer]. She wears a mask at work. She has not checked her BP in a long time.    ROS: See pertinent positives and negatives per HPI.  Past Medical History:  Diagnosis Date  . GERD (gastroesophageal reflux disease)   . Hypertension   . PID (acute pelvic inflammatory disease) 2003    Past Surgical History:  Procedure Laterality Date  . WISDOM TOOTH EXTRACTION      Family History  Problem Relation Age of Onset  . Ovarian cancer Unknown        Grandmother  . Diabetes Father   . Kidney disease Father      Current Outpatient Medications:  .  hydrochlorothiazide (HYDRODIURIL) 25 MG tablet, Take 1 tablet (25 mg total) by mouth daily., Disp: 90 tablet, Rfl: 3 .  levothyroxine (SYNTHROID) 50 MCG tablet, TAKE 1 TABLET BY MOUTH ONCE A DAY BEFORE BREAKFAST, Disp: 90 tablet, Rfl: 3 .  LORazepam (ATIVAN) 1 MG tablet, Take 1 tablet (1 mg total) by mouth every 6 (six) hours as needed. for anxiety, Disp: 60 tablet, Rfl: 5 .  metoprolol succinate (TOPROL-XL) 100 MG 24 hr tablet, TAKE 1 TABLET BY MOUTH DAILY. TAKE WITH OR IMMEDIATELY FOLLOWING A MEAL., Disp: 90 tablet, Rfl: 3 .  mupirocin ointment (BACTROBAN) 2 %, Place 1 application into  the nose 2 (two) times daily., Disp: 22 g, Rfl: 0 .  triamcinolone cream (KENALOG) 0.1 %, Apply 1 application topically 2 (two) times daily., Disp: 30 g, Rfl: 0  EXAM:  VITALS per patient if applicable:  GENERAL: alert, oriented, appears well and in no acute distress  HEENT: atraumatic, conjunttiva clear, no obvious abnormalities on inspection of external nose and ears  NECK: normal movements of the head and neck  LUNGS: on inspection no signs of respiratory distress, breathing rate appears normal, no obvious gross SOB, gasping or wheezing  CV: no obvious cyanosis  MS: moves all visible extremities without noticeable abnormality  PSYCH/NEURO: pleasant and cooperative, no obvious depression or anxiety, speech and thought processing grossly intact  ASSESSMENT AND PLAN: She has been doing well. We will refill her medications. She will come by next week to get labs, including a thyroid panel. We will plan on a complete exam later this year. Gershon Crane, MD   Discussed the following assessment and plan:  No diagnosis found.     I discussed the assessment and treatment plan with the patient. The patient was provided an opportunity to ask questions and all were answered. The patient agreed with the plan and demonstrated an understanding of the instructions.   The patient was advised to call back or seek an in-person evaluation if the symptoms worsen or if the condition fails to improve as  anticipated.     Review of Systems     Objective:   Physical Exam        Assessment & Plan:

## 2018-12-02 ENCOUNTER — Encounter: Payer: Self-pay | Admitting: Family Medicine

## 2018-12-03 MED ORDER — ACETAZOLAMIDE ER 500 MG PO CP12: 500.0000 mg | ORAL_CAPSULE | Freq: Two times a day (BID) | ORAL | 1 refills | Status: DC

## 2018-12-03 NOTE — Telephone Encounter (Signed)
Actually I would suggest she take the actual altitude sickness pill which is Diamox. Call in Diamox 500 mg bid for as long as she is in [place]. #30 with one rf

## 2018-12-03 NOTE — Telephone Encounter (Signed)
Patients pharmacy called in and wanted to make sure that this Rx is for the extended release medication since it was sent in for capsules.

## 2018-12-07 NOTE — Telephone Encounter (Signed)
Please advise 

## 2018-12-08 NOTE — Telephone Encounter (Signed)
Spoke with pharmacy, they are aware.

## 2018-12-08 NOTE — Telephone Encounter (Signed)
Yes this should be extended release

## 2019-02-04 ENCOUNTER — Other Ambulatory Visit: Payer: Self-pay | Admitting: Family Medicine

## 2019-02-07 NOTE — Telephone Encounter (Signed)
Okay for refill?  

## 2019-03-07 ENCOUNTER — Encounter

## 2019-03-07 ENCOUNTER — Ambulatory Visit: Payer: Managed Care, Other (non HMO) | Admitting: Podiatry

## 2019-03-07 ENCOUNTER — Ambulatory Visit (INDEPENDENT_AMBULATORY_CARE_PROVIDER_SITE_OTHER): Payer: Managed Care, Other (non HMO)

## 2019-03-07 ENCOUNTER — Other Ambulatory Visit: Payer: Self-pay | Admitting: Podiatry

## 2019-03-07 ENCOUNTER — Other Ambulatory Visit: Payer: Self-pay

## 2019-03-07 ENCOUNTER — Encounter: Payer: Self-pay | Admitting: Podiatry

## 2019-03-07 DIAGNOSIS — R600 Localized edema: Secondary | ICD-10-CM

## 2019-03-07 DIAGNOSIS — M84374A Stress fracture, right foot, initial encounter for fracture: Secondary | ICD-10-CM

## 2019-03-07 DIAGNOSIS — M79671 Pain in right foot: Secondary | ICD-10-CM

## 2019-03-07 NOTE — Progress Notes (Signed)
Subjective:   Patient ID: Becky Estrada, female   DOB: 38 y.o.   MRN: [id number]   HPI Patient presents stating she is developed a lot of pain in her right forefoot and states that it feels almost like she is walking on a broken bone.  She has been icing it and its been swelling quite a bit and been hurting for over a week and she works on cement floors   ROS      Objective:  Physical Exam  Neurovascular status intact muscle strength adequate with exquisite discomfort in the distal fourth metatarsal shaft with inflammation fluid buildup and swelling occurring around this area     Assessment:  Strong possibility for stress fracture of the right forefoot with forefoot edema noted right     Plan:  H&P reviewed condition and applied Unna boot in order to reduce the swelling and placed an air fracture walker with all instructions on usage.  Patient will be seen back for [place] to recheck and I did discuss with her stress fracture  X-rays indicate the probability for stress fracture of the neck of the fourth metatarsal right

## 2019-03-09 ENCOUNTER — Ambulatory Visit: Payer: Managed Care, Other (non HMO) | Admitting: Podiatry

## 2019-03-11 ENCOUNTER — Ambulatory Visit: Payer: Managed Care, Other (non HMO) | Admitting: Podiatry

## 2019-03-28 ENCOUNTER — Encounter: Payer: Self-pay | Admitting: Podiatry

## 2019-03-28 ENCOUNTER — Ambulatory Visit: Payer: Managed Care, Other (non HMO) | Admitting: Podiatry

## 2019-03-28 ENCOUNTER — Other Ambulatory Visit: Payer: Self-pay

## 2019-03-28 ENCOUNTER — Ambulatory Visit (INDEPENDENT_AMBULATORY_CARE_PROVIDER_SITE_OTHER): Payer: Managed Care, Other (non HMO)

## 2019-03-28 DIAGNOSIS — M84374D Stress fracture, right foot, subsequent encounter for fracture with routine healing: Secondary | ICD-10-CM

## 2019-03-28 DIAGNOSIS — M84374A Stress fracture, right foot, initial encounter for fracture: Secondary | ICD-10-CM

## 2019-03-28 DIAGNOSIS — M84374P Stress fracture, right foot, subsequent encounter for fracture with malunion: Secondary | ICD-10-CM

## 2019-03-30 NOTE — Progress Notes (Signed)
Subjective:   Patient ID: Becky Estrada, female   DOB: 38 y.o.   MRN: [id number]   HPI Patient states that she is improved but she still has an area of soreness on her right foot and still has been wearing the boot but gradually trying to return to shoe gear   ROS      Objective:  Physical Exam  Neurovascular status intact with acute inflammation still noted around the neck of the fourth metatarsal right with quite a bit of pain upon palpation in this area     Assessment:  Stress fracture fourth metatarsal neck painful but improved from previous visit     Plan:  H&P x-ray reviewed and I went ahead today and I advised that gradual healing is occurring but is not complete and I still want her to wear the boot part-time and use compression.  Reappoint for [place] to recheck again his symptoms indicate  X-rays indicate that there is a fracture of the neck of fourth metatarsal right with healing occurring chronic

## 2019-06-24 ENCOUNTER — Ambulatory Visit: Payer: Managed Care, Other (non HMO) | Attending: Internal Medicine

## 2019-06-24 DIAGNOSIS — Z23 Encounter for immunization: Secondary | ICD-10-CM

## 2019-06-24 NOTE — Progress Notes (Signed)
   Covid-19 Vaccination Clinic  Name:  Becky Estrada    MRN: [id number] DOB: 1980/08/16  06/24/2019  [name] was observed post Covid-19 immunization for 15 minutes without incident. She was provided with Vaccine Information Sheet and instruction to access the V-Safe system.   [name] was instructed to call 911 with any severe reactions post vaccine: Marland Kitchen Difficulty breathing  . Swelling of face and throat  . A fast heartbeat  . A bad rash all over body  . Dizziness and weakness   Immunizations Administered    Name Date Dose VIS Date Route   Pfizer COVID-19 Vaccine 06/24/2019  1:28 PM 0.3 mL 03/11/2019 Intramuscular   Manufacturer: [employer], [employer]   Lot: [id number]   NDC: [address]-5

## 2019-07-19 ENCOUNTER — Ambulatory Visit: Payer: Managed Care, Other (non HMO) | Attending: Internal Medicine

## 2019-07-19 DIAGNOSIS — Z23 Encounter for immunization: Secondary | ICD-10-CM

## 2019-07-19 NOTE — Progress Notes (Signed)
   Covid-19 Vaccination Clinic  Name:  Becky Estrada    MRN: [id number] DOB: 1980-08-06  07/19/2019  [name] was observed post Covid-19 immunization for 15 minutes without incident. She was provided with Vaccine Information Sheet and instruction to access the V-Safe system.   [name] was instructed to call 911 with any severe reactions post vaccine: Marland Kitchen Difficulty breathing  . Swelling of face and throat  . A fast heartbeat  . A bad rash all over body  . Dizziness and weakness   Immunizations Administered    Name Date Dose VIS Date Route   Pfizer COVID-19 Vaccine 07/19/2019 12:57 PM 0.3 mL 05/25/2018 Intramuscular   Manufacturer: [employer], [employer]   Lot: [id number]   NDC: [address]-6

## 2019-08-10 ENCOUNTER — Other Ambulatory Visit: Payer: Self-pay | Admitting: Family Medicine

## 2019-08-11 NOTE — Telephone Encounter (Signed)
Last fill 02/07/2019 Last OV 07/16/2018  Ok to fill?

## 2019-08-12 ENCOUNTER — Other Ambulatory Visit: Payer: Self-pay | Admitting: Family Medicine

## 2019-08-15 ENCOUNTER — Telehealth: Payer: Self-pay | Admitting: Family Medicine

## 2019-08-30 ENCOUNTER — Ambulatory Visit: Payer: Managed Care, Other (non HMO) | Admitting: Family Medicine

## 2019-08-30 MED ORDER — METOPROLOL SUCCINATE ER 100 MG PO TB24: ORAL_TABLET | ORAL | 0 refills | Status: DC

## 2019-08-30 NOTE — Addendum Note (Signed)
Addended by: Solon Augusta on: 08/30/2019 10:14 AM   Modules accepted: Orders

## 2019-08-30 NOTE — Telephone Encounter (Signed)
Pt overslept this morning and could not make her appt time. She is r/s for June 4th but the pharmacy gave her a 10 day emergency refill and she will run out this Thursday. Pt is wondering if this can be refilled until she is able to be seen.   Medication Refill: Metoprolol Pharmacy: [facility] FAX:[phone]  Pt would like a call at [phone]

## 2019-08-30 NOTE — Telephone Encounter (Signed)
30 day supply sent in. Pt is aware.

## 2019-09-02 ENCOUNTER — Other Ambulatory Visit: Payer: Self-pay

## 2019-09-02 ENCOUNTER — Encounter: Payer: Self-pay | Admitting: Family Medicine

## 2019-09-02 ENCOUNTER — Ambulatory Visit (INDEPENDENT_AMBULATORY_CARE_PROVIDER_SITE_OTHER): Payer: Managed Care, Other (non HMO) | Admitting: Family Medicine

## 2019-09-02 VITALS — BP 130/82 | HR 87 | Temp 98.2°F | Wt 230.2 lb

## 2019-09-02 DIAGNOSIS — I10 Essential (primary) hypertension: Secondary | ICD-10-CM | POA: Diagnosis not present

## 2019-09-02 DIAGNOSIS — E039 Hypothyroidism, unspecified: Secondary | ICD-10-CM

## 2019-09-02 LAB — HEPATIC FUNCTION PANEL
ALT: 73 U/L — ABNORMAL HIGH (ref 0–35)
AST: 78 U/L — ABNORMAL HIGH (ref 0–37)
Albumin: 4.1 g/dL (ref 3.5–5.2)
Alkaline Phosphatase: 78 U/L (ref 39–117)
Bilirubin, Direct: 0.1 mg/dL (ref 0.0–0.3)
Total Bilirubin: 0.6 mg/dL (ref 0.2–1.2)
Total Protein: 7.3 g/dL (ref 6.0–8.3)

## 2019-09-02 LAB — CBC WITH DIFFERENTIAL/PLATELET
Basophils Absolute: 0 10*3/uL (ref 0.0–0.1)
Basophils Relative: 0.6 % (ref 0.0–3.0)
Eosinophils Absolute: 0.2 10*3/uL (ref 0.0–0.7)
Eosinophils Relative: 3.8 % (ref 0.0–5.0)
HCT: 42.9 % (ref 36.0–46.0)
Hemoglobin: 15.3 g/dL — ABNORMAL HIGH (ref 12.0–15.0)
Lymphocytes Relative: 34.3 % (ref 12.0–46.0)
Lymphs Abs: 2 10*3/uL (ref 0.7–4.0)
MCHC: 35.6 g/dL (ref 30.0–36.0)
MCV: 101.9 fl — ABNORMAL HIGH (ref 78.0–100.0)
Monocytes Absolute: 0.6 10*3/uL (ref 0.1–1.0)
Monocytes Relative: 9.5 % (ref 3.0–12.0)
Neutro Abs: 3.1 10*3/uL (ref 1.4–7.7)
Neutrophils Relative %: 51.8 % (ref 43.0–77.0)
Platelets: 206 10*3/uL (ref 150.0–400.0)
RBC: 4.21 Mil/uL (ref 3.87–5.11)
RDW: 12.5 % (ref 11.5–15.5)
WBC: 6 10*3/uL (ref 4.0–10.5)

## 2019-09-02 LAB — BASIC METABOLIC PANEL
BUN: 5 mg/dL — ABNORMAL LOW (ref 6–23)
CO2: 29 mEq/L (ref 19–32)
Calcium: 9.3 mg/dL (ref 8.4–10.5)
Chloride: 103 mEq/L (ref 96–112)
Creatinine, Ser: 0.64 mg/dL (ref 0.40–1.20)
GFR: 102.08 mL/min (ref >60.00)
Glucose, Bld: 111 mg/dL — ABNORMAL HIGH (ref 70–99)
Potassium: 3.7 mEq/L (ref 3.5–5.1)
Sodium: 140 mEq/L (ref 135–145)

## 2019-09-02 LAB — LIPID PANEL
Cholesterol: 201 mg/dL — ABNORMAL HIGH (ref 0–200)
HDL: 46.9 mg/dL (ref >39.00)
LDL Cholesterol: 118 mg/dL — ABNORMAL HIGH (ref 0–99)
NonHDL: 154.54
Total CHOL/HDL Ratio: 4
Triglycerides: 181 mg/dL — ABNORMAL HIGH (ref 0.0–149.0)
VLDL: 36.2 mg/dL (ref 0.0–40.0)

## 2019-09-02 LAB — T4, FREE: Free T4: 0.74 ng/dL (ref 0.60–1.60)

## 2019-09-02 LAB — TSH: TSH: 4.41 u[IU]/mL (ref 0.35–4.50)

## 2019-09-02 LAB — T3, FREE: T3, Free: 3.1 pg/mL (ref 2.3–4.2)

## 2019-09-02 MED ORDER — METOPROLOL SUCCINATE ER 100 MG PO TB24: ORAL_TABLET | ORAL | 3 refills | Status: DC

## 2019-09-02 MED ORDER — HYDROCHLOROTHIAZIDE 25 MG PO TABS: 25.0000 mg | ORAL_TABLET | Freq: Every day | ORAL | 3 refills | Status: DC

## 2019-09-02 NOTE — Progress Notes (Signed)
   Subjective:    Patient ID: Becky Estrada, female    DOB: [birth year], 39 y.o.   MRN: [id number]  HPI Here to follow up. She feels fine. Her BP has been stable. However she admits that she stopped taking her Levothyroxine a year ago because she felt like it made her heart race. Each time she took it she says her heart raced for an hour and then it stopped. She admits that she was under a lot of stress at thst time since this was early in the Covid pandemic. She is not exercising.    Review of Systems  Constitutional: Negative.   Respiratory: Negative.   Cardiovascular: Negative.   Endocrine: Negative.   Neurological: Negative.        Objective:   Physical Exam Constitutional:      Appearance: She is obese.  Cardiovascular:     Rate and Rhythm: Normal rate and regular rhythm.     Pulses: Normal pulses.     Heart sounds: Normal heart sounds.  Pulmonary:     Effort: Pulmonary effort is normal.     Breath sounds: Normal breath sounds.  Neurological:     Mental Status: She is alert.           Assessment & Plan:  Her HTN is stable. meds were refilled. She is very overweight sn e disvussed diet and exercise. This is partly due, I am sure, to the fact that her hypothyroidism is not being treated.  We will get labs today, including a thyroid panel, and I told her we need to get back on thyroid replacement.  Gershon Crane, MD

## 2020-02-28 ENCOUNTER — Other Ambulatory Visit: Payer: Self-pay | Admitting: Family Medicine

## 2020-09-19 ENCOUNTER — Other Ambulatory Visit: Payer: Self-pay | Admitting: Family Medicine

## 2020-09-19 NOTE — Telephone Encounter (Signed)
Pt needs appointment for further refills 

## 2020-09-21 ENCOUNTER — Other Ambulatory Visit: Payer: Self-pay

## 2020-09-24 ENCOUNTER — Ambulatory Visit: Payer: Managed Care, Other (non HMO) | Admitting: Family Medicine

## 2020-09-24 ENCOUNTER — Other Ambulatory Visit: Payer: Self-pay

## 2020-09-24 ENCOUNTER — Encounter: Payer: Self-pay | Admitting: Family Medicine

## 2020-09-24 VITALS — BP 136/102 | HR 73 | Temp 98.0°F

## 2020-09-24 DIAGNOSIS — Z0000 Encounter for general adult medical examination without abnormal findings: Secondary | ICD-10-CM

## 2020-09-24 MED ORDER — LOSARTAN POTASSIUM-HCTZ 50-12.5 MG PO TABS
1.0000 | ORAL_TABLET | Freq: Every day | ORAL | 11 refills | Status: DC
Start: 2020-09-24 — End: 2021-09-25

## 2020-09-24 MED ORDER — LORAZEPAM 1 MG PO TABS: 1.0000 mg | ORAL_TABLET | Freq: Four times a day (QID) | ORAL | 5 refills | Status: DC | PRN

## 2020-09-24 MED ORDER — METOPROLOL SUCCINATE ER 100 MG PO TB24: ORAL_TABLET | ORAL | 11 refills | Status: DC

## 2020-09-24 NOTE — Progress Notes (Signed)
   Subjective:    Patient ID: Becky Estrada, female    DOB: 25-May-1980, 40 y.o.   MRN: [id number]  HPI Here for a well exam. She feels fine. She does not check her BP. We agreed for her to stop taking Synthroid a year ago.    Review of Systems  Constitutional: Negative.   HENT: Negative.    Eyes: Negative.   Respiratory: Negative.    Cardiovascular: Negative.   Gastrointestinal: Negative.   Genitourinary:  Negative for decreased urine volume, difficulty urinating, dyspareunia, dysuria, enuresis, flank pain, frequency, hematuria, pelvic pain and urgency.  Musculoskeletal: Negative.   Skin: Negative.   Neurological: Negative.  Negative for headaches.  Psychiatric/Behavioral: Negative.        Objective:   Physical Exam Constitutional:      General: She is not in acute distress.    Appearance: She is well-developed. She is obese.  HENT:     Head: Normocephalic and atraumatic.     Right Ear: External ear normal.     Left Ear: External ear normal.     Nose: Nose normal.     Mouth/Throat:     Pharynx: No oropharyngeal exudate.  Eyes:     General: No scleral icterus.    Conjunctiva/sclera: Conjunctivae normal.     Pupils: Pupils are equal, round, and reactive to light.  Neck:     Thyroid: No thyromegaly.     Vascular: No JVD.  Cardiovascular:     Rate and Rhythm: Normal rate and regular rhythm.     Heart sounds: Normal heart sounds. No murmur heard.   No friction rub. No gallop.  Pulmonary:     Effort: Pulmonary effort is normal. No respiratory distress.     Breath sounds: Normal breath sounds. No wheezing or rales.  Chest:     Chest wall: No tenderness.  Abdominal:     General: Bowel sounds are normal. There is no distension.     Palpations: Abdomen is soft. There is no mass.     Tenderness: There is no abdominal tenderness. There is no guarding or rebound.  Musculoskeletal:        General: No tenderness. Normal range of motion.     Cervical back: Normal range of  motion and neck supple.  Lymphadenopathy:     Cervical: No cervical adenopathy.  Skin:    General: Skin is warm and dry.     Findings: No erythema or rash.  Neurological:     Mental Status: She is alert and oriented to person, place, and time.     Cranial Nerves: No cranial nerve deficit.     Motor: No abnormal muscle tone.     Coordination: Coordination normal.     Deep Tendon Reflexes: Reflexes are normal and symmetric. Reflexes normal.  Psychiatric:        Behavior: Behavior normal.        Thought Content: Thought content normal.        Judgment: Judgment normal.          Assessment & Plan:  Well exam. We discussed diet and exercise. Get fasting labs soon. We will change her HCTZ to Losartan HCT 50-12.5 daily to add to her Metoprolol. Recheck BP in 3-4 weeks.  Gershon Crane, MD

## 2020-09-27 ENCOUNTER — Other Ambulatory Visit (INDEPENDENT_AMBULATORY_CARE_PROVIDER_SITE_OTHER): Payer: Managed Care, Other (non HMO)

## 2020-09-27 ENCOUNTER — Other Ambulatory Visit: Payer: Self-pay

## 2020-09-27 DIAGNOSIS — Z0000 Encounter for general adult medical examination without abnormal findings: Secondary | ICD-10-CM | POA: Diagnosis not present

## 2020-09-27 LAB — HEPATIC FUNCTION PANEL
ALT: 31 U/L (ref 0–35)
AST: 23 U/L (ref 0–37)
Albumin: 3.9 g/dL (ref 3.5–5.2)
Alkaline Phosphatase: 64 U/L (ref 39–117)
Bilirubin, Direct: 0.2 mg/dL (ref 0.0–0.3)
Total Bilirubin: 0.9 mg/dL (ref 0.2–1.2)
Total Protein: 7.2 g/dL (ref 6.0–8.3)

## 2020-09-27 LAB — LIPID PANEL
Cholesterol: 176 mg/dL (ref 0–200)
HDL: 44.2 mg/dL (ref >39.00)
LDL Cholesterol: 92 mg/dL (ref 0–99)
NonHDL: 132.22
Total CHOL/HDL Ratio: 4
Triglycerides: 200 mg/dL — ABNORMAL HIGH (ref 0.0–149.0)
VLDL: 40 mg/dL (ref 0.0–40.0)

## 2020-09-27 LAB — BASIC METABOLIC PANEL
BUN: 9 mg/dL (ref 6–23)
CO2: 28 mEq/L (ref 19–32)
Calcium: 9.1 mg/dL (ref 8.4–10.5)
Chloride: 99 mEq/L (ref 96–112)
Creatinine, Ser: 0.7 mg/dL (ref 0.40–1.20)
GFR: 106.66 mL/min (ref >60.00)
Glucose, Bld: 96 mg/dL (ref 70–99)
Potassium: 3.7 mEq/L (ref 3.5–5.1)
Sodium: 135 mEq/L (ref 135–145)

## 2020-09-27 LAB — CBC WITH DIFFERENTIAL/PLATELET
Basophils Absolute: 0 10*3/uL (ref 0.0–0.1)
Basophils Relative: 0.6 % (ref 0.0–3.0)
Eosinophils Absolute: 0.2 10*3/uL (ref 0.0–0.7)
Eosinophils Relative: 4.4 % (ref 0.0–5.0)
HCT: 42.1 % (ref 36.0–46.0)
Hemoglobin: 15.1 g/dL — ABNORMAL HIGH (ref 12.0–15.0)
Lymphocytes Relative: 39.7 % (ref 12.0–46.0)
Lymphs Abs: 1.9 10*3/uL (ref 0.7–4.0)
MCHC: 36 g/dL (ref 30.0–36.0)
MCV: 100.1 fl — ABNORMAL HIGH (ref 78.0–100.0)
Monocytes Absolute: 0.4 10*3/uL (ref 0.1–1.0)
Monocytes Relative: 8.1 % (ref 3.0–12.0)
Neutro Abs: 2.2 10*3/uL (ref 1.4–7.7)
Neutrophils Relative %: 47.2 % (ref 43.0–77.0)
Platelets: 217 10*3/uL (ref 150.0–400.0)
RBC: 4.2 Mil/uL (ref 3.87–5.11)
RDW: 12.7 % (ref 11.5–15.5)
WBC: 4.7 10*3/uL (ref 4.0–10.5)

## 2020-09-27 LAB — T4, FREE: Free T4: 0.74 ng/dL (ref 0.60–1.60)

## 2020-09-27 LAB — T3, FREE: T3, Free: 3.4 pg/mL (ref 2.3–4.2)

## 2020-09-27 LAB — TSH: TSH: 5.08 u[IU]/mL (ref 0.35–5.50)

## 2020-09-27 LAB — HEMOGLOBIN A1C: Hgb A1c MFr Bld: 5.3 % (ref 4.6–6.5)

## 2020-10-09 ENCOUNTER — Other Ambulatory Visit: Payer: Self-pay

## 2020-10-10 ENCOUNTER — Ambulatory Visit: Payer: Managed Care, Other (non HMO) | Admitting: Family Medicine

## 2020-10-10 ENCOUNTER — Encounter: Payer: Self-pay | Admitting: Family Medicine

## 2020-10-10 VITALS — BP 110/64 | HR 89 | Temp 97.9°F | Ht 63.0 in | Wt 214.0 lb

## 2020-10-10 DIAGNOSIS — Z309 Encounter for contraceptive management, unspecified: Secondary | ICD-10-CM

## 2020-10-10 DIAGNOSIS — L409 Psoriasis, unspecified: Secondary | ICD-10-CM | POA: Diagnosis not present

## 2020-10-10 DIAGNOSIS — H6192 Disorder of left external ear, unspecified: Secondary | ICD-10-CM

## 2020-10-10 MED ORDER — NORGESTIM-ETH ESTRAD TRIPHASIC 0.18/0.215/0.25 MG-35 MCG PO TABS: 1.0000 | ORAL_TABLET | Freq: Every day | ORAL | 2 refills | Status: DC

## 2020-10-10 MED ORDER — BETAMETHASONE DIPROPIONATE 0.05 % EX CREA: TOPICAL_CREAM | Freq: Two times a day (BID) | CUTANEOUS | 2 refills | Status: DC

## 2020-10-10 NOTE — Progress Notes (Signed)
   Subjective:    Patient ID: Becky Estrada, female    DOB: 1980/08/25, 40 y.o.   MRN: [id number]  HPI Here for several issues. First she asks about getting back on birth control. She has been seeing [facility], but not since her IUD was removed in March 2020. She had not been sexually active until recently, but now she is in a relationship. Her menses are regular, and her LMP was 09-20-20. Also she asks me to check a spot on her left ear that has been present for 3 years now, but it has recently grown larger. It does not bother her. Lastly she has a spot of skin on her scalp that itches all the time and is very flaky. This appeared about a year ago. She has been applying Triamcinolone cream to it, but this has not helped much.    Review of Systems  Constitutional: Negative.   Respiratory: Negative.    Cardiovascular: Negative.   Genitourinary:  Negative for menstrual problem.  Skin:  Positive for rash.      Objective:   Physical Exam Constitutional:      Appearance: Normal appearance.  Cardiovascular:     Rate and Rhythm: Normal rate and regular rhythm.     Pulses: Normal pulses.     Heart sounds: Normal heart sounds.  Pulmonary:     Effort: Pulmonary effort is normal.     Breath sounds: Normal breath sounds.  Skin:    Comments: There is a small area of red skin in the top of the left ear lobe. This is scaly and 1/3 of the border is slightly raised. There is also a round 2 cm area of macular red skin at the right frontoparietal hairline which is scaly.   Neurological:     Mental Status: She is alert.          Assessment & Plan:  As far as contraception, she needs to see her GYN again and have a pelvic exam and Pap smear asap. We will go ahead and start her on Tri-Sprintec daily until she can see them. 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 W [facility]. The other  spot on her scalp is psoriasis, and we will treat this with Diprolene cream BID. We spent 35 minutes reviewing records and discussing these issues.  Gershon Crane, MD

## 2020-12-16 ENCOUNTER — Encounter: Payer: Self-pay | Admitting: Family Medicine

## 2020-12-18 ENCOUNTER — Other Ambulatory Visit: Payer: Self-pay

## 2020-12-18 MED ORDER — SCOPOLAMINE 1 MG/3DAYS TD PT72: 1.0000 | MEDICATED_PATCH | TRANSDERMAL | 0 refills | Status: DC

## 2020-12-18 NOTE — Telephone Encounter (Signed)
Call in transdermal scopolamine patches to wear one patch every 3 days as needed, one box of 10 patches

## 2021-01-08 ENCOUNTER — Telehealth: Payer: Managed Care, Other (non HMO) | Admitting: Family Medicine

## 2021-01-08 ENCOUNTER — Encounter: Payer: Self-pay | Admitting: Family Medicine

## 2021-01-08 VITALS — Temp 102.0°F

## 2021-01-08 DIAGNOSIS — U071 COVID-19: Secondary | ICD-10-CM

## 2021-01-08 MED ORDER — NIRMATRELVIR/RITONAVIR (PAXLOVID)TABLET: 3.0000 | ORAL_TABLET | Freq: Two times a day (BID) | ORAL | 0 refills | Status: AC

## 2021-01-08 NOTE — Progress Notes (Signed)
Subjective:    Patient ID: Becky Estrada, female    DOB: 21-Jan-1981, 40 y.o.   MRN: [id number]  HPI Here for 2 days of fever to 102 degrees, body aches, fatigue, and a dry cough. No SOB or chest pain. No NVD. She tested positive for the Covid-19 virus this morning. Drinking fluids and taking Motrin.  Virtual Visit via Video Note  I connected with the patient on 01/08/21 at  2:15 PM EDT by a video enabled telemedicine application and verified that I am speaking with the correct person using two identifiers.  Location patient: home Location provider:work or home office Persons participating in the virtual visit: patient, provider  I discussed the limitations of evaluation and management by telemedicine and the availability of in person appointments. The patient expressed understanding and agreed to proceed.   HPI:    ROS: See pertinent positives and negatives per HPI.  Past Medical History:  Diagnosis Date   GERD (gastroesophageal reflux disease)    Hypertension    PID (acute pelvic inflammatory disease) 2003    Past Surgical History:  Procedure Laterality Date   WISDOM TOOTH EXTRACTION      Family History  Problem Relation Age of Onset   Ovarian cancer Other        Grandmother   Diabetes Father    Kidney disease Father      Current Outpatient Medications:    betamethasone dipropionate 0.05 % cream, Apply topically 2 (two) times daily., Disp: 45 g, Rfl: 2   fluorouracil (EFUDEX) 5 % cream, SMARTSIG:2 Topical Twice Daily, Disp: , Rfl:    LORazepam (ATIVAN) 1 MG tablet, Take 1 tablet (1 mg total) by mouth every 6 (six) hours as needed. for anxiety, Disp: 60 tablet, Rfl: 5   losartan-hydrochlorothiazide (HYZAAR) 50-12.5 MG tablet, Take 1 tablet by mouth daily., Disp: 30 tablet, Rfl: 11   metoprolol succinate (TOPROL-XL) 100 MG 24 hr tablet, TAKE ONE TABLET BY MOUTH DAILY WITH OR IMMEDIATELY FOLLOWING A MEAL, Disp: 30 tablet, Rfl: 11   mupirocin ointment (BACTROBAN)  2 %, Place 1 application into the nose 2 (two) times daily., Disp: 22 g, Rfl: 0   nirmatrelvir/ritonavir EUA (PAXLOVID) 20 x 150 MG & 10 x 100MG  TABS, Take 3 tablets by mouth 2 (two) times daily for 5 days. (Take nirmatrelvir 150 mg two tablets twice daily for 5 days and ritonavir 100 mg one tablet twice daily for 5 days) Patient GFR is 106, Disp: 30 tablet, Rfl: 0   Norgestimate-Ethinyl Estradiol Triphasic (TRI-SPRINTEC) 0.18/0.215/0.25 MG-35 MCG tablet, Take 1 tablet by mouth daily., Disp: 28 tablet, Rfl: 2   scopolamine (TRANSDERM-SCOP) 1 MG/3DAYS, Place 1 patch (1.5 mg total) onto the skin every 3 (three) days., Disp: 10 patch, Rfl: 0  EXAM:  VITALS per patient if applicable:  GENERAL: alert, oriented, appears well and in no acute distress  HEENT: atraumatic, conjunttiva clear, no obvious abnormalities on inspection of external nose and ears  NECK: normal movements of the head and neck  LUNGS: on inspection no signs of respiratory distress, breathing rate appears normal, no obvious gross SOB, gasping or wheezing  CV: no obvious cyanosis  MS: moves all visible extremities without noticeable abnormality  PSYCH/NEURO: pleasant and cooperative, no obvious depression or anxiety, speech and thought processing grossly intact  ASSESSMENT AND PLAN: Covid-19 infection. Treat with 5 days of Paxlovid. Quarantine for 5 days.  04-28-1981, MD  Discussed the following assessment and plan:  No diagnosis found.  I discussed the assessment and treatment plan with the patient. The patient was provided an opportunity to ask questions and all were answered. The patient agreed with the plan and demonstrated an understanding of the instructions.   The patient was advised to call back or seek an in-person evaluation if the symptoms worsen or if the condition fails to improve as anticipated.     Review of Systems     Objective:   Physical Exam        Assessment & Plan:

## 2021-04-15 ENCOUNTER — Other Ambulatory Visit: Payer: Self-pay | Admitting: Family Medicine

## 2021-04-16 NOTE — Telephone Encounter (Signed)
Pt LOV was 10/10/2020 Last refill done on 09/24/2020 Please advise

## 2021-05-02 ENCOUNTER — Other Ambulatory Visit: Payer: Self-pay

## 2021-05-02 ENCOUNTER — Encounter: Payer: Self-pay | Admitting: Family Medicine

## 2021-05-02 MED ORDER — FLUCONAZOLE 150 MG PO TABS: 150.0000 mg | ORAL_TABLET | Freq: Once | ORAL | 5 refills | Status: AC

## 2021-05-02 NOTE — Telephone Encounter (Signed)
Call in Diflucan 150 mg, #1 tablet with 5 rf

## 2021-06-04 ENCOUNTER — Ambulatory Visit: Payer: Managed Care, Other (non HMO) | Admitting: Family Medicine

## 2021-06-04 ENCOUNTER — Encounter: Payer: Self-pay | Admitting: Family Medicine

## 2021-06-04 VITALS — BP 128/78 | HR 63 | Temp 97.8°F | Wt 209.0 lb

## 2021-06-04 DIAGNOSIS — B029 Zoster without complications: Secondary | ICD-10-CM | POA: Diagnosis not present

## 2021-06-04 DIAGNOSIS — L309 Dermatitis, unspecified: Secondary | ICD-10-CM | POA: Diagnosis not present

## 2021-06-04 DIAGNOSIS — Z7184 Encounter for health counseling related to travel: Secondary | ICD-10-CM | POA: Diagnosis not present

## 2021-06-04 MED ORDER — SULFAMETHOXAZOLE-TRIMETHOPRIM 800-160 MG PO TABS: ORAL_TABLET | ORAL | 0 refills | Status: DC

## 2021-06-04 MED ORDER — VALACYCLOVIR HCL 1 G PO TABS: 1000.0000 mg | ORAL_TABLET | Freq: Three times a day (TID) | ORAL | 0 refills | Status: AC

## 2021-06-04 MED ORDER — TRIAMCINOLONE ACETONIDE 0.1 % EX CREA: 1.0000 "application " | TOPICAL_CREAM | Freq: Two times a day (BID) | CUTANEOUS | 5 refills | Status: AC

## 2021-06-04 MED ORDER — ATOVAQUONE-PROGUANIL HCL 250-100 MG PO TABS: ORAL_TABLET | ORAL | 0 refills | Status: DC

## 2021-06-04 NOTE — Progress Notes (Signed)
? ?  Subjective:  ? ? Patient ID: Becky Estrada, female    DOB: 02-Nov-1980, 41 y.o.   MRN: [id number] ? ?HPI ?Here for several issues. First she complains of itching and burning pains in the left side of her trunk that began about 10 days ago. These areas itched for 5 days and now they have been painful for 5 days. The worst area is on the flank and milder areas are around to the left middle back. No visible rashes. Second, she will be travelling to [place], [place] in a few weeks to spend 7 days there visiting her son. She asks what type of preventive measures should she take before going there. Third, she has areas of rough, itchy skin on both lower legs that started about a month ago.  ? ? ?Review of Systems  ?Constitutional: Negative.   ?Respiratory: Negative.    ?Cardiovascular: Negative.   ?Gastrointestinal: Negative.   ?Genitourinary: Negative.   ?Skin:  Positive for rash.  ? ?   ?Objective:  ? Physical Exam ?Constitutional:   ?   General: She is not in acute distress. ?   Appearance: Normal appearance.  ?Cardiovascular:  ?   Rate and Rhythm: Normal rate and regular rhythm.  ?   Pulses: Normal pulses.  ?   Heart sounds: Normal heart sounds.  ?Pulmonary:  ?   Effort: Pulmonary effort is normal.  ?   Breath sounds: Normal breath sounds.  ?Abdominal:  ?   General: Abdomen is flat. Bowel sounds are normal. There is no distension.  ?   Palpations: Abdomen is soft. There is no mass.  ?   Tenderness: There is no abdominal tenderness. There is no right CVA tenderness, left CVA tenderness, guarding or rebound.  ?   Hernia: No hernia is present.  ?Skin: ?   Comments: There is a 5 cm area of skin just inferior to the left lower rib margin that appears normal, but this is exquisitely sensitive to light touch. There are also areas of pink, scaly skin on both lower legs   ?Neurological:  ?   Mental Status: She is alert.  ? ? ? ? ? ?   ?Assessment & Plan:  ?First, she has shingles (even though there is no visible rash).  We will treat this with 10 days of Valtrex. Second, for travel to [place] I advised her to get a hepatitis A / hepatitis B vaccine (Twinrix) today and she will take Malarone tablets daily beginning 2 days before going and to continue through 7 days after she comes home (16 tabs) for malaria prevention. She is also given a course of Bactrim DS to take in case she gets a diarrheal illness. Third, she has eczema and we will treat this with Triamcinolone cream. We spent a total of ( 35  ) minutes reviewing records and discussing these issues.  ?Gershon Crane, MD ? ? ?

## 2021-09-25 ENCOUNTER — Other Ambulatory Visit: Payer: Self-pay | Admitting: Family Medicine

## 2021-09-25 DIAGNOSIS — I10 Essential (primary) hypertension: Secondary | ICD-10-CM

## 2021-09-27 ENCOUNTER — Ambulatory Visit (INDEPENDENT_AMBULATORY_CARE_PROVIDER_SITE_OTHER): Payer: Managed Care, Other (non HMO)

## 2021-09-27 ENCOUNTER — Ambulatory Visit: Payer: Managed Care, Other (non HMO) | Admitting: Podiatry

## 2021-09-27 DIAGNOSIS — M7751 Other enthesopathy of right foot: Secondary | ICD-10-CM | POA: Diagnosis not present

## 2021-09-27 DIAGNOSIS — M779 Enthesopathy, unspecified: Secondary | ICD-10-CM | POA: Diagnosis not present

## 2021-09-27 MED ORDER — TRIAMCINOLONE ACETONIDE 10 MG/ML IJ SUSP
20.0000 mg | Freq: Once | INTRAMUSCULAR | Status: AC
  Administered 2021-09-27: 20 mg

## 2021-09-29 NOTE — Progress Notes (Signed)
Subjective:   Patient ID: Becky Estrada, female   DOB: 41 y.o.   MRN: [id number]   HPI Patient presents with 2 significant pain to the right foot with 1 being pain of the joint and then also pain now on the dorsal lateral aspect of the foot that is very sore   ROS      Objective:  Physical Exam  Neurovascular status intact with inflammation of the third metatarsal phalangeal joint with fluid buildup and inflammation of the lateral tendon group peroneal tertius which is probably compensatory but has become its own problem     Assessment:  Inflammatory peroneal tertius inflammation right along with inflammatory capsulitis of the third MPJ right foot     Plan:  H&P conditions reviewed and I went ahead today I did do a forefoot block right I then aspirated the third MPJ 3 mg dexamethasone Kenalog 5 mg Xylocaine after getting out clear fluid and then on the lateral side of the foot I did sterile prep and injected the peroneal tertius complex 3 mg dexamethasone Kenalog 5 mg Xylocaine  X-rays indicate there is no indication of bony issue with this is appears to be soft tissue

## 2021-10-23 ENCOUNTER — Other Ambulatory Visit: Payer: Self-pay | Admitting: Family Medicine

## 2021-10-23 NOTE — Telephone Encounter (Signed)
Last refill-04/16/2021-60 tabs, 5 refills Last OV- 06/04/21  No future OV scheduled.

## 2021-12-22 ENCOUNTER — Other Ambulatory Visit: Payer: Self-pay | Admitting: Family Medicine

## 2021-12-22 DIAGNOSIS — I10 Essential (primary) hypertension: Secondary | ICD-10-CM

## 2021-12-29 ENCOUNTER — Other Ambulatory Visit: Payer: Self-pay | Admitting: Family Medicine

## 2021-12-29 DIAGNOSIS — I10 Essential (primary) hypertension: Secondary | ICD-10-CM

## 2022-03-28 ENCOUNTER — Other Ambulatory Visit: Payer: Self-pay | Admitting: Family Medicine

## 2022-03-28 DIAGNOSIS — I10 Essential (primary) hypertension: Secondary | ICD-10-CM

## 2022-04-25 ENCOUNTER — Other Ambulatory Visit: Payer: Self-pay | Admitting: Family Medicine

## 2022-06-24 ENCOUNTER — Other Ambulatory Visit: Payer: Self-pay | Admitting: Family Medicine

## 2022-06-24 DIAGNOSIS — I10 Essential (primary) hypertension: Secondary | ICD-10-CM

## 2022-06-25 ENCOUNTER — Other Ambulatory Visit: Payer: Self-pay | Admitting: Family Medicine

## 2022-06-25 DIAGNOSIS — I10 Essential (primary) hypertension: Secondary | ICD-10-CM

## 2022-07-04 ENCOUNTER — Encounter: Payer: Self-pay | Admitting: Family Medicine

## 2022-07-04 ENCOUNTER — Ambulatory Visit: Payer: Managed Care, Other (non HMO) | Admitting: Family Medicine

## 2022-07-04 VITALS — BP 110/80 | HR 71 | Temp 98.0°F | Wt 216.0 lb

## 2022-07-04 DIAGNOSIS — I10 Essential (primary) hypertension: Secondary | ICD-10-CM

## 2022-07-04 DIAGNOSIS — L309 Dermatitis, unspecified: Secondary | ICD-10-CM

## 2022-07-04 DIAGNOSIS — M25562 Pain in left knee: Secondary | ICD-10-CM

## 2022-07-04 DIAGNOSIS — F411 Generalized anxiety disorder: Secondary | ICD-10-CM

## 2022-07-04 DIAGNOSIS — G8929 Other chronic pain: Secondary | ICD-10-CM

## 2022-07-04 MED ORDER — BETAMETHASONE DIPROPIONATE 0.05 % EX CREA: TOPICAL_CREAM | Freq: Two times a day (BID) | CUTANEOUS | 2 refills | Status: AC

## 2022-07-04 MED ORDER — LOSARTAN POTASSIUM-HCTZ 50-12.5 MG PO TABS: 1.0000 | ORAL_TABLET | Freq: Every day | ORAL | 3 refills | Status: DC

## 2022-07-04 MED ORDER — LORAZEPAM 1 MG PO TABS: 1.0000 mg | ORAL_TABLET | Freq: Four times a day (QID) | ORAL | 5 refills | Status: DC | PRN

## 2022-07-04 MED ORDER — METOPROLOL SUCCINATE ER 100 MG PO TB24: ORAL_TABLET | ORAL | 3 refills | Status: DC

## 2022-07-04 NOTE — Progress Notes (Signed)
   Subjective:    Patient ID: Becky Estrada, female    DOB: [birth year], 42 y.o.   MRN: [id number]  HPI Here for medication refills. She feels well and her BP has been well controlled. She will be leaving on May 27 to drive her car to [place] so she can work on a temporary job at [employer] there. The total time from when she leaves [place] to when she get back home with be 7 weeks, and she needs enough medication to take with her. She also mentions having a pain in the left knee that started a few months ago. Her current job requires her to be on her feet for 8-10 hours, and this can really bother her knee. No hx of trauma. No swelling.    Review of Systems  Constitutional: Negative.   Respiratory: Negative.    Cardiovascular: Negative.   Musculoskeletal:  Positive for arthralgias.  Neurological: Negative.        Objective:   Physical Exam Constitutional:      Appearance: Normal appearance.     Comments: She walks normally   Cardiovascular:     Rate and Rhythm: Normal rate and regular rhythm.     Pulses: Normal pulses.     Heart sounds: Normal heart sounds.  Pulmonary:     Effort: Pulmonary effort is normal.     Breath sounds: Normal breath sounds.  Musculoskeletal:     Right lower leg: No edema.     Left lower leg: No edema.  Neurological:     Mental Status: She is alert.           Assessment & Plan:  Her HTN  and her eczema are well controlled. We refilled her medications. Her anxiety is stable. 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 I advised her to apply ice packs and to take Ibuprofen as needed.  Gershon Crane, MD

## 2022-09-30 ENCOUNTER — Ambulatory Visit: Payer: Managed Care, Other (non HMO) | Admitting: Family Medicine

## 2022-09-30 ENCOUNTER — Encounter: Payer: Self-pay | Admitting: Family Medicine

## 2022-09-30 VITALS — BP 120/78 | HR 61 | Temp 98.0°F | Wt 216.0 lb

## 2022-09-30 DIAGNOSIS — M25472 Effusion, left ankle: Secondary | ICD-10-CM

## 2022-09-30 DIAGNOSIS — Z0000 Encounter for general adult medical examination without abnormal findings: Secondary | ICD-10-CM

## 2022-09-30 DIAGNOSIS — M25471 Effusion, right ankle: Secondary | ICD-10-CM | POA: Insufficient documentation

## 2022-09-30 LAB — HEPATIC FUNCTION PANEL
ALT: 21 U/L (ref 0–35)
AST: 25 U/L (ref 0–37)
Albumin: 3.7 g/dL (ref 3.5–5.2)
Alkaline Phosphatase: 47 U/L (ref 39–117)
Bilirubin, Direct: 0.1 mg/dL (ref 0.0–0.3)
Total Bilirubin: 0.6 mg/dL (ref 0.2–1.2)
Total Protein: 7.1 g/dL (ref 6.0–8.3)

## 2022-09-30 LAB — HEMOGLOBIN A1C: Hgb A1c MFr Bld: 4.9 % (ref 4.6–6.5)

## 2022-09-30 LAB — BASIC METABOLIC PANEL
BUN: 8 mg/dL (ref 6–23)
CO2: 25 mEq/L (ref 19–32)
Calcium: 9.2 mg/dL (ref 8.4–10.5)
Chloride: 102 mEq/L (ref 96–112)
Creatinine, Ser: 0.68 mg/dL (ref 0.40–1.20)
GFR: 105.9 mL/min (ref >60.00)
Glucose, Bld: 92 mg/dL (ref 70–99)
Potassium: 3.8 mEq/L (ref 3.5–5.1)
Sodium: 135 mEq/L (ref 135–145)

## 2022-09-30 LAB — CBC WITH DIFFERENTIAL/PLATELET
Basophils Absolute: 0 10*3/uL (ref 0.0–0.1)
Basophils Relative: 0.6 % (ref 0.0–3.0)
Eosinophils Absolute: 0.2 10*3/uL (ref 0.0–0.7)
Eosinophils Relative: 4.7 % (ref 0.0–5.0)
HCT: 38.9 % (ref 36.0–46.0)
Hemoglobin: 13.4 g/dL (ref 12.0–15.0)
Lymphocytes Relative: 36.5 % (ref 12.0–46.0)
Lymphs Abs: 1.6 10*3/uL (ref 0.7–4.0)
MCHC: 34.3 g/dL (ref 30.0–36.0)
MCV: 103.5 fl — ABNORMAL HIGH (ref 78.0–100.0)
Monocytes Absolute: 0.5 10*3/uL (ref 0.1–1.0)
Monocytes Relative: 10.6 % (ref 3.0–12.0)
Neutro Abs: 2.1 10*3/uL (ref 1.4–7.7)
Neutrophils Relative %: 47.6 % (ref 43.0–77.0)
Platelets: 204 10*3/uL (ref 150.0–400.0)
RBC: 3.76 Mil/uL — ABNORMAL LOW (ref 3.87–5.11)
RDW: 12.5 % (ref 11.5–15.5)
WBC: 4.4 10*3/uL (ref 4.0–10.5)

## 2022-09-30 LAB — LIPID PANEL
Cholesterol: 132 mg/dL (ref 0–200)
HDL: 31.9 mg/dL — ABNORMAL LOW (ref >39.00)
LDL Cholesterol: 78 mg/dL (ref 0–99)
NonHDL: 99.98
Total CHOL/HDL Ratio: 4
Triglycerides: 110 mg/dL (ref 0.0–149.0)
VLDL: 22 mg/dL (ref 0.0–40.0)

## 2022-09-30 LAB — TSH: TSH: 4.24 u[IU]/mL (ref 0.35–5.50)

## 2022-09-30 MED ORDER — FUROSEMIDE 20 MG PO TABS: 20.0000 mg | ORAL_TABLET | Freq: Every day | ORAL | 3 refills | Status: DC | PRN

## 2022-09-30 NOTE — Progress Notes (Signed)
Subjective:    Patient ID: Becky Estrada, female    DOB: 1980-04-14, 42 y.o.   MRN: [id number]  HPI Here for a well exam. She is proud to tell me that she has not had any alcohol for 45 days, and she feels much better. She intends to stay off alcohol. Her BP has been stable, but she complains about swelling in the ankles at the end of the day. This started a month or so ago. No SOB.    Review of Systems  Constitutional: Negative.   HENT: Negative.    Eyes: Negative.   Respiratory: Negative.    Cardiovascular:  Positive for leg swelling.  Gastrointestinal: Negative.   Genitourinary:  Negative for decreased urine volume, difficulty urinating, dyspareunia, dysuria, enuresis, flank pain, frequency, hematuria, pelvic pain and urgency.  Musculoskeletal: Negative.   Skin: Negative.   Neurological: Negative.  Negative for headaches.  Psychiatric/Behavioral: Negative.         Objective:   Physical Exam Constitutional:      General: She is not in acute distress.    Appearance: She is well-developed. She is obese.  HENT:     Head: Normocephalic and atraumatic.     Right Ear: External ear normal.     Left Ear: External ear normal.     Nose: Nose normal.     Mouth/Throat:     Pharynx: No oropharyngeal exudate.  Eyes:     General: No scleral icterus.    Conjunctiva/sclera: Conjunctivae normal.     Pupils: Pupils are equal, round, and reactive to light.  Neck:     Thyroid: No thyromegaly.     Vascular: No JVD.  Cardiovascular:     Rate and Rhythm: Normal rate and regular rhythm.     Pulses: Normal pulses.     Heart sounds: Normal heart sounds. No murmur heard.    No friction rub. No gallop.  Pulmonary:     Effort: Pulmonary effort is normal. No respiratory distress.     Breath sounds: Normal breath sounds. No wheezing or rales.  Chest:     Chest wall: No tenderness.  Abdominal:     General: Bowel sounds are normal. There is no distension.     Palpations: Abdomen is soft.  There is no mass.     Tenderness: There is no abdominal tenderness. There is no guarding or rebound.  Musculoskeletal:        General: No tenderness. Normal range of motion.     Cervical back: Normal range of motion and neck supple.     Comments: 1+ edema in both ankles   Lymphadenopathy:     Cervical: No cervical adenopathy.  Skin:    General: Skin is warm and dry.     Findings: No erythema or rash.  Neurological:     General: No focal deficit present.     Mental Status: She is alert and oriented to person, place, and time.     Cranial Nerves: No cranial nerve deficit.     Motor: No abnormal muscle tone.     Coordination: Coordination normal.     Deep Tendon Reflexes: Reflexes are normal and symmetric. Reflexes normal.  Psychiatric:        Mood and Affect: Mood normal.        Behavior: Behavior normal.        Thought Content: Thought content normal.        Judgment: Judgment normal.  Assessment & Plan:  Well exam. We discussed diet and exercise. Get fasting labs. For the ankle edema, she will try lasix 20 mg daily as needed. We discussed limiting her sodium intake. She also has a pair of compression stockings, and I suggested she wear these when she is working.  Gershon Crane, MD

## 2022-10-01 ENCOUNTER — Encounter: Payer: Self-pay | Admitting: Podiatry

## 2022-10-01 ENCOUNTER — Ambulatory Visit (INDEPENDENT_AMBULATORY_CARE_PROVIDER_SITE_OTHER): Payer: Managed Care, Other (non HMO)

## 2022-10-01 ENCOUNTER — Other Ambulatory Visit: Payer: Self-pay | Admitting: Podiatry

## 2022-10-01 ENCOUNTER — Ambulatory Visit: Payer: Managed Care, Other (non HMO) | Admitting: Podiatry

## 2022-10-01 DIAGNOSIS — M79671 Pain in right foot: Secondary | ICD-10-CM

## 2022-10-01 DIAGNOSIS — M779 Enthesopathy, unspecified: Secondary | ICD-10-CM | POA: Diagnosis not present

## 2022-10-01 DIAGNOSIS — M79672 Pain in left foot: Secondary | ICD-10-CM

## 2022-10-01 DIAGNOSIS — M7671 Peroneal tendinitis, right leg: Secondary | ICD-10-CM

## 2022-10-01 MED ORDER — TRIAMCINOLONE ACETONIDE 10 MG/ML IJ SUSP
20.0000 mg | Freq: Once | INTRAMUSCULAR | Status: AC
Start: 2022-10-01 — End: 2022-10-01
  Administered 2022-10-01: 20 mg

## 2022-10-01 NOTE — Progress Notes (Signed)
Subjective:   Patient ID: Becky Estrada, female   DOB: 42 y.o.   MRN: [id number]   HPI Patient presents stating she is having a lot of pain in both of her feet with the left being more the top of the right being the side.  States that she had an old stress fracture is somewhat remind but it has been present for around 6 weeks and has remained tender.     ROS      Objective:  Physical Exam  Neurovascular status was found to be intact muscle strength was found to be adequate range of motion adequate.  Patient is found to have inflammation of the dorsal left foot around the metatarsal phalangeal joints with mild swelling diffuse not located to one bone specifically and has inflammation pain in the right peroneal tendon which has been present in the past and which probably reoccurred if she has been walking differently.     Assessment:  Probability for acute tendinitis left cannot rule out stress fracture peroneal tendinitis right     Plan:  H&P x-rays taken sterile prep injected the tendon complex left 3 mg Dexasone Kenalog 5 mg Xylocaine in the peroneal insertion right 3 mg Dexasone Kenalog 5 mg Xylocaine.  Advised on reduced activity reappoint as symptoms indicate  X-rays were negative for signs of fracture or bony injury appears to be soft tissue

## 2023-02-15 ENCOUNTER — Other Ambulatory Visit: Payer: Self-pay | Admitting: Family Medicine

## 2023-02-16 NOTE — Telephone Encounter (Signed)
Pt LOV was 09/30/22 Last refill was done on 07/04/22 Please advise

## 2023-04-24 ENCOUNTER — Ambulatory Visit (INDEPENDENT_AMBULATORY_CARE_PROVIDER_SITE_OTHER): Payer: Managed Care, Other (non HMO)

## 2023-04-24 ENCOUNTER — Ambulatory Visit: Payer: Managed Care, Other (non HMO) | Admitting: Podiatry

## 2023-04-24 DIAGNOSIS — T148XXA Other injury of unspecified body region, initial encounter: Secondary | ICD-10-CM

## 2023-04-24 DIAGNOSIS — M722 Plantar fascial fibromatosis: Secondary | ICD-10-CM

## 2023-04-27 NOTE — Progress Notes (Signed)
Subjective:   Patient ID: Becky Estrada, female   DOB: 42 y.o.   MRN: [id number]   HPI Patient presents stating the pain in the outside of both her feet has been intense.  States that the last time we put medicine and she only got relief for a very short period of time and has just been dealing with the pain but is trying to see is or anything that could be possible permanent   ROS      Objective:  Physical Exam  Edema around the peroneal insertion bilateral fluid buildup around the area with pain and possible dysfunction of tendon     Assessment:  Possibility for interstitial tearing of the peroneal insertion proximal to the base of fifth metatarsal head bilateral with intense discomfort     Plan:  Reviewed with the patient and at this point we are ordering MRIs to try to understand the nature of pathology the possibility for tears and correction and fixing.  Patient will be seen back when we get results of MRI to decide what long-term may be possible  X-rays indicate that there does not appear to be bony pathology even with the intensity and increased pain the patient is experiencing.  No indication fracture arthritis

## 2023-04-29 ENCOUNTER — Ambulatory Visit: Payer: Managed Care, Other (non HMO) | Admitting: Podiatry

## 2023-05-10 ENCOUNTER — Ambulatory Visit
Admission: RE | Admit: 2023-05-10 | Discharge: 2023-05-10 | Disposition: A | Discharge status: Home / Self Care | Payer: Managed Care, Other (non HMO) | Source: Ambulatory Visit | Attending: Podiatry | Admitting: Podiatry

## 2023-05-10 ENCOUNTER — Ambulatory Visit
Admission: RE | Admit: 2023-05-10 | Discharge: 2023-05-10 | Discharge status: Home / Self Care | Payer: Managed Care, Other (non HMO) | Source: Ambulatory Visit | Attending: Podiatry

## 2023-05-10 DIAGNOSIS — T148XXA Other injury of unspecified body region, initial encounter: Secondary | ICD-10-CM

## 2023-06-10 ENCOUNTER — Encounter: Payer: Self-pay | Admitting: Podiatry

## 2023-06-10 ENCOUNTER — Ambulatory Visit (INDEPENDENT_AMBULATORY_CARE_PROVIDER_SITE_OTHER): Admitting: Podiatry

## 2023-06-10 DIAGNOSIS — M7671 Peroneal tendinitis, right leg: Secondary | ICD-10-CM | POA: Diagnosis not present

## 2023-06-10 DIAGNOSIS — M7672 Peroneal tendinitis, left leg: Secondary | ICD-10-CM

## 2023-06-10 MED ORDER — TRIAMCINOLONE ACETONIDE 10 MG/ML IJ SUSP
10.0000 mg | Freq: Once | INTRAMUSCULAR | Status: AC
  Administered 2023-06-10: 10 mg via INTRA_ARTICULAR

## 2023-06-10 NOTE — Progress Notes (Signed)
 Subjective:   Patient ID: Becky Estrada, female   DOB: 43 y.o.   MRN: [id number]   HPI Patient presents for MRI results and also has the discomfort not all the time but that if she is real active   ROS      Objective:  Physical Exam  Neuro vas status intact discomfort still around the peroneal insertion fifth metatarsal base right and around calcaneocuboid with no other pathology noted     Assessment:  MRI which did not indicate peroneal pathology bilateral with the inflammation present     Plan:  Reviewed MRI at this point I did go ahead I did sterile prep I injected the tendon complex bilateral after explaining risk 3 mg Dexasone Kenalog 5 mg Xylocaine applied ankle compression stockings bilateral due to swelling and reappoint as symptoms indicate

## 2023-07-29 ENCOUNTER — Ambulatory Visit (INDEPENDENT_AMBULATORY_CARE_PROVIDER_SITE_OTHER): Admitting: Family Medicine

## 2023-07-29 ENCOUNTER — Ambulatory Visit: Attending: Family Medicine

## 2023-07-29 VITALS — BP 118/68 | HR 52 | Temp 98.2°F | Ht 62.75 in | Wt 210.0 lb

## 2023-07-29 DIAGNOSIS — I10 Essential (primary) hypertension: Secondary | ICD-10-CM

## 2023-07-29 DIAGNOSIS — Z0000 Encounter for general adult medical examination without abnormal findings: Secondary | ICD-10-CM | POA: Diagnosis not present

## 2023-07-29 DIAGNOSIS — R002 Palpitations: Secondary | ICD-10-CM

## 2023-07-29 DIAGNOSIS — Z131 Encounter for screening for diabetes mellitus: Secondary | ICD-10-CM | POA: Diagnosis not present

## 2023-07-29 MED ORDER — LOSARTAN POTASSIUM-HCTZ 50-12.5 MG PO TABS: 1.0000 | ORAL_TABLET | Freq: Every day | ORAL | 3 refills | Status: AC

## 2023-07-29 MED ORDER — METOPROLOL SUCCINATE ER 100 MG PO TB24: ORAL_TABLET | ORAL | 3 refills | Status: AC

## 2023-07-29 NOTE — Progress Notes (Signed)
 Subjective:    Patient ID: Becky Estrada, female    DOB: 02/14/1981, 43 y.o.   MRN: [id number]  HPI Here for a well exam. She feels well most of the time. She completely stopped drinking alcohol one year ago, and she feels much better than she used to. She has more energy and she sleeps better. She has lost 20 lbs in the past 6 months. She does admit to feeling palpitations in her chest from time to time. These occur 1-2 times a week. They are not related to exertion. No chest pain or SOB. They last anywhere from 5 to 30 seconds at a time.    Review of Systems  Constitutional: Negative.   HENT: Negative.    Eyes: Negative.   Respiratory: Negative.    Cardiovascular:  Positive for palpitations. Negative for chest pain and leg swelling.  Gastrointestinal: Negative.   Genitourinary:  Negative for decreased urine volume, difficulty urinating, dyspareunia, dysuria, enuresis, flank pain, frequency, hematuria, pelvic pain and urgency.  Musculoskeletal: Negative.   Skin: Negative.   Neurological: Negative.  Negative for headaches.  Psychiatric/Behavioral: Negative.         Objective:   Physical Exam Constitutional:      General: She is not in acute distress.    Appearance: Normal appearance. She is well-developed.  HENT:     Head: Normocephalic and atraumatic.     Right Ear: External ear normal.     Left Ear: External ear normal.     Nose: Nose normal.     Mouth/Throat:     Pharynx: No oropharyngeal exudate.  Eyes:     General: No scleral icterus.    Conjunctiva/sclera: Conjunctivae normal.     Pupils: Pupils are equal, round, and reactive to light.  Neck:     Thyroid : No thyromegaly.     Vascular: No JVD.  Cardiovascular:     Rate and Rhythm: Normal rate and regular rhythm.     Pulses: Normal pulses.     Heart sounds: Normal heart sounds. No murmur heard.    No friction rub. No gallop.     Comments: She has occasional ectopic beats, the EKG today shows sinus bradycardia  with no ectopy  Pulmonary:     Effort: Pulmonary effort is normal. No respiratory distress.     Breath sounds: Normal breath sounds. No wheezing or rales.  Chest:     Chest wall: No tenderness.  Abdominal:     General: Bowel sounds are normal. There is no distension.     Palpations: Abdomen is soft. There is no mass.     Tenderness: There is no abdominal tenderness. There is no guarding or rebound.  Musculoskeletal:        General: No tenderness. Normal range of motion.     Cervical back: Normal range of motion and neck supple.  Lymphadenopathy:     Cervical: No cervical adenopathy.  Skin:    General: Skin is warm and dry.     Findings: No erythema or rash.  Neurological:     General: No focal deficit present.     Mental Status: She is alert and oriented to person, place, and time.     Cranial Nerves: No cranial nerve deficit.     Motor: No abnormal muscle tone.     Coordination: Coordination normal.     Deep Tendon Reflexes: Reflexes are normal and symmetric. Reflexes normal.  Psychiatric:        Mood and Affect: Mood  normal.        Behavior: Behavior normal.        Thought Content: Thought content normal.        Judgment: Judgment normal.           Assessment & Plan:  Well exam. We discussed diet and exercise. Get fasting labs. For the palpitations, we will have her wear a 14 day Zio patch and we will set up an ECHO. Corita Diego, MD

## 2023-07-29 NOTE — Progress Notes (Unsigned)
 EP to read.

## 2023-07-30 LAB — CBC WITH DIFFERENTIAL/PLATELET
Basophils Absolute: 0 K/uL (ref 0.0–0.1)
Basophils Relative: 0.3 % (ref 0.0–3.0)
Eosinophils Absolute: 0.2 K/uL (ref 0.0–0.7)
Eosinophils Relative: 3.1 % (ref 0.0–5.0)
HCT: 41.8 % (ref 36.0–46.0)
Hemoglobin: 14.6 g/dL (ref 12.0–15.0)
Lymphocytes Relative: 31.6 % (ref 12.0–46.0)
Lymphs Abs: 2.2 K/uL (ref 0.7–4.0)
MCHC: 35 g/dL (ref 30.0–36.0)
MCV: 100.8 fl — ABNORMAL HIGH (ref 78.0–100.0)
Monocytes Absolute: 0.5 K/uL (ref 0.1–1.0)
Monocytes Relative: 6.5 % (ref 3.0–12.0)
Neutro Abs: 4.1 K/uL (ref 1.4–7.7)
Neutrophils Relative %: 58.5 % (ref 43.0–77.0)
Platelets: 255 K/uL (ref 150.0–400.0)
RBC: 4.14 Mil/uL (ref 3.87–5.11)
RDW: 13.3 % (ref 11.5–15.5)
WBC: 7 K/uL (ref 4.0–10.5)

## 2023-07-30 LAB — HEPATIC FUNCTION PANEL
ALT: 26 U/L (ref 0–35)
AST: 25 U/L (ref 0–37)
Albumin: 4.1 g/dL (ref 3.5–5.2)
Alkaline Phosphatase: 52 U/L (ref 39–117)
Bilirubin, Direct: 0.1 mg/dL (ref 0.0–0.3)
Total Bilirubin: 0.8 mg/dL (ref 0.2–1.2)
Total Protein: 7.5 g/dL (ref 6.0–8.3)

## 2023-07-30 LAB — LIPID PANEL
Cholesterol: 156 mg/dL (ref 0–200)
HDL: 36.6 mg/dL — ABNORMAL LOW (ref >39.00)
LDL Cholesterol: 99 mg/dL (ref 0–99)
NonHDL: 119.86
Total CHOL/HDL Ratio: 4
Triglycerides: 105 mg/dL (ref 0.0–149.0)
VLDL: 21 mg/dL (ref 0.0–40.0)

## 2023-07-30 LAB — BASIC METABOLIC PANEL WITH GFR
BUN: 7 mg/dL (ref 6–23)
CO2: 27 meq/L (ref 19–32)
Calcium: 9.5 mg/dL (ref 8.4–10.5)
Chloride: 103 meq/L (ref 96–112)
Creatinine, Ser: 0.76 mg/dL (ref 0.40–1.20)
GFR: 94.73 mL/min (ref >60.00)
Glucose, Bld: 80 mg/dL (ref 70–99)
Potassium: 3.5 meq/L (ref 3.5–5.1)
Sodium: 139 meq/L (ref 135–145)

## 2023-07-30 LAB — TSH: TSH: 2.03 u[IU]/mL (ref 0.35–5.50)

## 2023-07-30 LAB — HEMOGLOBIN A1C: Hgb A1c MFr Bld: 5.1 % (ref 4.6–6.5)

## 2023-08-03 ENCOUNTER — Encounter: Payer: Self-pay | Admitting: *Deleted

## 2023-08-25 ENCOUNTER — Encounter (HOSPITAL_BASED_OUTPATIENT_CLINIC_OR_DEPARTMENT_OTHER): Payer: Self-pay | Admitting: Family Medicine

## 2023-08-30 ENCOUNTER — Other Ambulatory Visit: Payer: Self-pay | Admitting: Family Medicine

## 2023-08-31 NOTE — Telephone Encounter (Signed)
 Pt LOV was on 07/29/23 Last refill done on 02/16/23 Please advise

## 2024-02-17 ENCOUNTER — Encounter: Payer: Self-pay | Admitting: Podiatry

## 2024-02-17 ENCOUNTER — Ambulatory Visit: Admitting: Podiatry

## 2024-02-17 DIAGNOSIS — M7672 Peroneal tendinitis, left leg: Secondary | ICD-10-CM

## 2024-02-17 DIAGNOSIS — M7671 Peroneal tendinitis, right leg: Secondary | ICD-10-CM | POA: Diagnosis not present

## 2024-02-17 MED ORDER — TRIAMCINOLONE ACETONIDE 10 MG/ML IJ SUSP
10.0000 mg | Freq: Once | INTRAMUSCULAR | Status: AC
  Administered 2024-02-17: 10 mg via INTRA_ARTICULAR

## 2024-02-18 NOTE — Progress Notes (Signed)
 Subjective:   Patient ID: Becky Estrada, female   DOB: 43 y.o.   MRN: [id number]   HPI Patient states overall she has been doing well but she has started to develop discomfort in the lateral side of both feet.  States she did have around 7 months of relief   ROS      Objective:  Physical Exam  Neurovascular status intact with inflammation of the peroneal tendon both right and left foot with fluid buildup that did very well for approximately 7 months     Assessment:  Chronic peroneal tendinitis bilateral that did respond well     Plan:  Sterile prep injected carefully sheath bilateral after explaining risk 3 mg dexamethasone Kenalog  5 mg Xylocaine near the insertion to the base of the fifth metatarsal.  Applied sterile dressing reappoint as symptoms indicate

## 2024-02-29 ENCOUNTER — Encounter: Payer: Self-pay | Admitting: Family Medicine

## 2024-02-29 ENCOUNTER — Ambulatory Visit: Admitting: Family Medicine

## 2024-02-29 VITALS — BP 118/78 | HR 61 | Temp 97.9°F | Wt 216.0 lb

## 2024-02-29 DIAGNOSIS — L732 Hidradenitis suppurativa: Secondary | ICD-10-CM | POA: Insufficient documentation

## 2024-02-29 DIAGNOSIS — J019 Acute sinusitis, unspecified: Secondary | ICD-10-CM

## 2024-02-29 MED ORDER — AZITHROMYCIN 250 MG PO TABS: ORAL_TABLET | ORAL | 0 refills | Status: AC

## 2024-02-29 NOTE — Progress Notes (Signed)
   Subjective:    Patient ID: Becky Estrada, female    DOB: 1980-04-23, 43 y.o.   MRN: [id number]  HPI Here for 2 issues. First about 10 days ago she developed a fever to 101 degrees for 2 days, and then this went away. This was accompanied by a ST and sinus congestion. Now she has a stuffy head and pressure in the right ear. She has taken some Advil Cold and Sinus. The other issue is her GYN recently gave her a RX for Metformin to help with her occasional flares of hydradenitis. Jacqualine says this does not really bother her that much , and she asks of she should take the Metformin or not. Her last A1c in April was 5.1%.    Review of Systems  Constitutional: Negative.   HENT:  Positive for congestion, ear pain, postnasal drip and sinus pressure. Negative for sore throat.   Eyes: Negative.   Respiratory: Negative.         Objective:   Physical Exam Constitutional:      Appearance: Normal appearance.  HENT:     Right Ear: Tympanic membrane, ear canal and external ear normal.     Left Ear: Tympanic membrane, ear canal and external ear normal.     Nose: Nose normal.     Mouth/Throat:     Pharynx: Oropharynx is clear.  Eyes:     Conjunctiva/sclera: Conjunctivae normal.  Pulmonary:     Effort: Pulmonary effort is normal.     Breath sounds: Normal breath sounds.  Lymphadenopathy:     Cervical: No cervical adenopathy.  Neurological:     Mental Status: She is alert.           Assessment & Plan:  She has a sinusitis, and we will treat this with a Zpack. As for the Metformin, I advised her to not take this due to the risks of hypoglycemia and of possible side effects like diarrhea. If she decides she wants to treat the hydradenitis with a daily medication, I think a low dose antibiotic would be a better choice.  Garnette Olmsted, MD

## 2024-04-27 ENCOUNTER — Other Ambulatory Visit: Payer: Self-pay | Admitting: Family Medicine
